# Patient Record
Sex: Female | Born: 1980 | Race: White | Hispanic: No | Marital: Married | Smoking: Never smoker
Health system: Southern US, Community
[De-identification: ages and names within clinical notes are randomized; demographics above are authoritative.]

## PROBLEM LIST (undated history)

## (undated) DIAGNOSIS — K219 Gastro-esophageal reflux disease without esophagitis: Secondary | ICD-10-CM

## (undated) DIAGNOSIS — Z9889 Other specified postprocedural states: Secondary | ICD-10-CM

## (undated) DIAGNOSIS — R112 Nausea with vomiting, unspecified: Secondary | ICD-10-CM

## (undated) HISTORY — PX: OTHER SURGICAL HISTORY: SHX169

## (undated) HISTORY — PX: DILATION AND CURETTAGE OF UTERUS: SHX78

---
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
## 2011-05-19 HISTORY — PX: OTHER SURGICAL HISTORY: SHX169

## 2011-11-04 ENCOUNTER — Ambulatory Visit: Payer: Self-pay | Admitting: Obstetrics and Gynecology

## 2011-11-04 LAB — BASIC METABOLIC PANEL
Anion Gap: 8 (ref 7–16)
Calcium, Total: 8.7 mg/dL (ref 8.5–10.1)
Chloride: 105 mmol/L (ref 98–107)
EGFR (African American): >60
EGFR (Non-African Amer.): >60
Glucose: 90 mg/dL (ref 65–99)
Osmolality: 278 (ref 275–301)
Potassium: 3.5 mmol/L (ref 3.5–5.1)
Sodium: 140 mmol/L (ref 136–145)

## 2011-11-04 LAB — CBC
HCT: 41.7 % (ref 35.0–47.0)
HGB: 14.2 g/dL (ref 12.0–16.0)
MCH: 31.4 pg (ref 26.0–34.0)
MCHC: 34.1 g/dL (ref 32.0–36.0)
Platelet: 256 10*3/uL (ref 150–440)

## 2011-11-04 LAB — PREGNANCY, URINE: Pregnancy Test, Urine: NEGATIVE m[IU]/mL

## 2011-11-06 ENCOUNTER — Ambulatory Visit: Payer: Self-pay | Admitting: Obstetrics and Gynecology

## 2011-11-11 LAB — PATHOLOGY REPORT

## 2012-03-25 ENCOUNTER — Other Ambulatory Visit: Payer: Self-pay | Admitting: Obstetrics and Gynecology

## 2012-03-25 LAB — GLUCOSE, RANDOM: Glucose: 90 mg/dL (ref 65–99)

## 2013-05-18 HISTORY — PX: INTRAUTERINE DEVICE (IUD) INSERTION: SHX5877

## 2013-09-12 ENCOUNTER — Encounter: Payer: Self-pay | Admitting: Pediatric Cardiology

## 2014-02-15 ENCOUNTER — Inpatient Hospital Stay: Payer: Self-pay | Admitting: Obstetrics and Gynecology

## 2014-02-15 LAB — CBC WITH DIFFERENTIAL/PLATELET
BASOS ABS: 0 10*3/uL (ref 0.0–0.1)
Basophil %: 0.3 %
Eosinophil #: 0.1 10*3/uL (ref 0.0–0.7)
Eosinophil %: 1.2 %
HCT: 40.2 % (ref 35.0–47.0)
HGB: 13.5 g/dL (ref 12.0–16.0)
LYMPHS ABS: 1.8 10*3/uL (ref 1.0–3.6)
LYMPHS PCT: 16.1 %
MCH: 31 pg (ref 26.0–34.0)
MCHC: 33.7 g/dL (ref 32.0–36.0)
MCV: 92 fL (ref 80–100)
Monocyte #: 0.9 x10 3/mm (ref 0.2–0.9)
Monocyte %: 8.5 %
Neutrophil #: 8 10*3/uL — ABNORMAL HIGH (ref 1.4–6.5)
Neutrophil %: 73.9 %
Platelet: 213 10*3/uL (ref 150–440)
RBC: 4.37 10*6/uL (ref 3.80–5.20)
RDW: 13.7 % (ref 11.5–14.5)
WBC: 10.9 10*3/uL (ref 3.6–11.0)

## 2014-02-16 LAB — COMPREHENSIVE METABOLIC PANEL
ALT: 16 U/L
ANION GAP: 9 (ref 7–16)
Albumin: 2.3 g/dL — ABNORMAL LOW (ref 3.4–5.0)
Alkaline Phosphatase: 140 U/L — ABNORMAL HIGH
BUN: 14 mg/dL (ref 7–18)
Bilirubin,Total: 0.4 mg/dL (ref 0.2–1.0)
CALCIUM: 8 mg/dL — AB (ref 8.5–10.1)
CREATININE: 0.78 mg/dL (ref 0.60–1.30)
Chloride: 108 mmol/L — ABNORMAL HIGH (ref 98–107)
Co2: 23 mmol/L (ref 21–32)
GLUCOSE: 65 mg/dL (ref 65–99)
OSMOLALITY: 278 (ref 275–301)
POTASSIUM: 3.9 mmol/L (ref 3.5–5.1)
SGOT(AST): 20 U/L (ref 15–37)
SODIUM: 140 mmol/L (ref 136–145)
Total Protein: 5.1 g/dL — ABNORMAL LOW (ref 6.4–8.2)

## 2014-02-16 LAB — CREATININE, URINE, RANDOM: CREATININE, URINE RANDOM: 124.4 mg/dL (ref 30.0–125.0)

## 2014-02-16 LAB — PROTEIN, URINE, RANDOM: PROTEIN, RANDOM URINE: 349 mg/dL — AB (ref 0–12)

## 2014-02-18 LAB — HEMATOCRIT: HCT: 26 % — AB (ref 35.0–47.0)

## 2014-02-19 LAB — CBC WITH DIFFERENTIAL/PLATELET
BASOS ABS: 0 10*3/uL (ref 0.0–0.1)
Basophil %: 0.3 %
EOS ABS: 0.2 10*3/uL (ref 0.0–0.7)
EOS PCT: 2.2 %
HCT: 22.8 % — AB (ref 35.0–47.0)
HGB: 7.8 g/dL — AB (ref 12.0–16.0)
Lymphocyte #: 1.6 10*3/uL (ref 1.0–3.6)
Lymphocyte %: 17.1 %
MCH: 31.5 pg (ref 26.0–34.0)
MCHC: 34.1 g/dL (ref 32.0–36.0)
MCV: 92 fL (ref 80–100)
Monocyte #: 0.8 x10 3/mm (ref 0.2–0.9)
Monocyte %: 8 %
NEUTROS ABS: 6.9 10*3/uL — AB (ref 1.4–6.5)
Neutrophil %: 72.4 %
Platelet: 168 10*3/uL (ref 150–440)
RBC: 2.47 10*6/uL — ABNORMAL LOW (ref 3.80–5.20)
RDW: 13.7 % (ref 11.5–14.5)
WBC: 9.6 10*3/uL (ref 3.6–11.0)

## 2014-02-20 LAB — PIH PROFILE
AST: 49 U/L — AB (ref 15–37)
Anion Gap: 7 (ref 7–16)
BUN: 12 mg/dL (ref 7–18)
CALCIUM: 8.4 mg/dL — AB (ref 8.5–10.1)
CHLORIDE: 109 mmol/L — AB (ref 98–107)
Co2: 25 mmol/L (ref 21–32)
Creatinine: 0.73 mg/dL (ref 0.60–1.30)
EGFR (African American): >60
GLUCOSE: 88 mg/dL (ref 65–99)
HCT: 23.7 % — AB (ref 35.0–47.0)
HGB: 8.2 g/dL — ABNORMAL LOW (ref 12.0–16.0)
MCH: 31.9 pg (ref 26.0–34.0)
MCHC: 34.5 g/dL (ref 32.0–36.0)
MCV: 93 fL (ref 80–100)
Osmolality: 280 (ref 275–301)
POTASSIUM: 3.9 mmol/L (ref 3.5–5.1)
Platelet: 241 10*3/uL (ref 150–440)
RBC: 2.55 10*6/uL — ABNORMAL LOW (ref 3.80–5.20)
RDW: 13.5 % (ref 11.5–14.5)
SODIUM: 141 mmol/L (ref 136–145)
Uric Acid: 5.5 mg/dL (ref 2.6–6.0)
WBC: 6.9 10*3/uL (ref 3.6–11.0)

## 2014-02-21 LAB — PROTEIN, URINE, RANDOM: PROTEIN, RANDOM URINE: 10 mg/dL (ref 0–12)

## 2014-02-21 LAB — CREATININE, URINE, RANDOM: Creatinine, Urine Random: 19.2 mg/dL — ABNORMAL LOW (ref 30.0–125.0)

## 2014-05-18 HISTORY — PX: IUD REMOVAL: SHX5392

## 2014-08-27 ENCOUNTER — Encounter: Payer: Self-pay | Admitting: *Deleted

## 2014-09-08 NOTE — Op Note (Signed)
PATIENT NAME:  Tiffany [name], Tiffany [name] MR#:  [id number] DATE OF BIRTH:  1980/07/17  DATE OF PROCEDURE:  02/17/2014  PREOPERATIVE DIAGNOSES: 76.  A 34 year old gravida 1  at 40 weeks 6 days gestation.  2.  Failed induction of labor secondary to active phase arrest at 8 centimeters.  3.  Preeclampsia, without severe features based on blood pressure and protein-creatinine ratio. 4.  Polyhydramnios with an amniotic fluid index of 28 cm.  POSTOPERATIVE DIAGNOSES:  51.  A 34 year old gravida 1  at 40 weeks 6 days gestation.  2.  Failed induction of labor secondary to active phase arrest at 8 centimeters.  3.  Preeclampsia, without severe features based on blood pressure and protein-creatinine ratio. 4.  Two small bladder windows. 5.  Polyhydramnios with an amniotic fluid index of 28 cm.  OPERATION PERFORMED: Low transverse cesarean section via Pfannenstiel incision and oversewing of the noted bladder windows.   ANESTHESIA USED:  Epidural.  PRIMARY SURGEON: Stoney Bang. Georgianne Fick, MD  PREOPERATIVE ANTIBIOTICS: Ancef 2 grams.  ESTIMATED BLOOD LOSS:  900 mL.   OPERATIVE FLUIDS: 1500 mL of crystalloid.  URINE OUTPUT: 200 mL.  DRAINS OR TUBES: Foley to gravity drainage and On-Q catheter system.   IMPLANTS: None.   COMPLICATIONS: Two thin areas were noted on the bladder serosa. 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 The patient did have frankly bloody urine very early during her labor course and actually had her Foley catheter replaced secondary to clot.   INTRAOPERATIVE FINDINGS: Normal tubes, ovaries, and uterus. On entry into the abdominal cavity, the bladder was noted to be distended and had two small bladder windows. There were no apparent cystotomies and the entry into the peritoneal cavity had been accomplished superior to the bladder.  Delivery resulted in the birth of a liveborn female infant weighing 3530 grams, 7 pounds  13 ounces, Apgars 9 and 9.   SPECIMENS REMOVED: None.   COMPLICATIONS: None.   THE PATIENT'S CONDITION FOLLOWING PROCEDURE: Stable.   PROCEDURE IN DETAIL: Risks, benefits, and alternatives of the procedure were discussed with the patient prior to proceeding to the operating room. The patient had had a protracted  active phase, and had been started on Pitocin and had an IUPC placed showing adequate MVUs with no change over 4 hours. We did have a growth scan within the past week, estimating fetal weight at 8 pounds 1 ounce. The pelvis on clinical pelvimetry felt adequate. However, given the lack of progress and the adequate contractions, we discussed that this likely represented either a problem with the pelvis or an issue with the baby's size.  The fetus was noted to be in the OA position.   After discussion, patient elected to proceed with primary low transverse C-section for delivery.  She was taken to the operating room where her epidural was dosed for C-section. She was prepped and draped in the usual sterile fashion. A timeout was performed and the level of anesthetic was checked and noted to be adequate prior to proceeding with the case.   A Pfannenstiel skin incision was made 2 cm above the pubic symphysis, carried down sharply to the level of the rectus fascia. The fascia was incised in the midline. The fascial incision was extended using Mayo scissors. The superior border of the fascia was grasped with 2 Kocher  clamps. The underlying rectus muscles were dissected off the fascia bluntly. The median raphe was incised using Mayo scissors. The  inferior border of the rectus fascia was dissected off the rectus muscle in a similar fashion. The midline was identified and the peritoneal cavity was entered bluntly in the superior portion under the median raphe. The peritoneal incision was then extended using manual traction. The bladder appeared to be somewhat distended and inspection of the bladder also  revealed 3 areas which appeared to have some thinning of the serosal lining of the bladder. A bladder blade was placed. A bladder flap was then developed using Metzenbaum scissors, further developed digitally before replacing the bladder blade to displace the bladder caudally. A low transverse incision was then scored on the lower uterine segment. The uterus was entered bluntly using the operator's finger. The uterine incision was extended using manual traction. On placing the operator's hand into the hysterotomy incision, the infant was noted to be in the OA position, wedged into the pelvis. The vertex was grasped, flexed, brought to the level of the hysterotomy and delivered atraumatically using fundal pressure. There was some difficulty in delivering the shoulders, necessitating delivery of the anterior arm, which happened to be the right fetal arm. This was accomplished by splinting the arm across the fetal chest and delivering it. The remainder of the body then delivered with ease. Cord was clamped and cut. The infant was suctioned and passed to the awaiting pediatricians. Placenta was delivered using manual extraction. The uterus was exteriorized, repaired in a 2 layer closure with the first being a running locked 0 Vicryl, the second a vertical imbricating of 0 Vicryl. Following uterine closure, the uterus was reinspected. There was one aspect on the right aspect of the incision which had some oozing and was oversewn with a simple figure-of-eight of 0 Vicryl. The uterus was then returned to the abdomen. The peritoneal gutters were wiped clean of clots and debris. The hysterotomy was reinspected and continued to be hemostatic. The bladder was reinspected noting 2 small thinnings in the serosal lining. These were oversewn in a running fashion using a 4-0 Monocryl. Following oversewing of the bladder, the peritoneum was closed using a running 2-0 Vicryl. A 2-0 Vicryl was then used to place a mattress stitch and  reapproximate the rectus muscles in the midline. The fascia was tented up using a Kocher clamp. The On-Q trocars were placed subfascially per the usual protocol and used to introduce the On-Q catheters while removing the trocars. The fascia was closed using a looped #1 PDS in a running fashion. Following fascial closure, the subcutaneous dead space was reapproximated using 3 interrupted stitches of 0 Vicryl. Skin was closed using a 4-0 Monocryl in a subcuticular fashion. The incision was dressed with Dermabond as was the On-Q catheter site. The On-Q catheter sites were bolused with 5 mL of 0.5% bupivacaine each. Sponge, needle, and instrument counts were correct x 2. The patient tolerated the procedure well and was taken to recovery room in stable condition. Prior to the hysterotomy closure, there was some uterine atony encountered. This was manage with 10 units of intramuscular Pitocin administered directly into the uterus.   ____________________________ Stoney Bang. Georgianne Fick, MD ams:LT D: 02/18/2014 10:25:27 ET T: 02/18/2014 17:19:55 ET JOB#: [id number]  cc: Stoney Bang. Georgianne Fick, MD, <Dictator> Conan Bowens Madelon Lips MD ELECTRONICALLY SIGNED 03/15/2014 13:31

## 2014-09-17 ENCOUNTER — Ambulatory Visit: Payer: 59 | Attending: Nurse Practitioner | Admitting: Physical Therapy

## 2014-09-17 ENCOUNTER — Encounter: Payer: Self-pay | Admitting: Physical Therapy

## 2014-09-17 DIAGNOSIS — R278 Other lack of coordination: Secondary | ICD-10-CM

## 2014-09-17 DIAGNOSIS — Z7409 Other reduced mobility: Secondary | ICD-10-CM

## 2014-09-17 DIAGNOSIS — M533 Sacrococcygeal disorders, not elsewhere classified: Secondary | ICD-10-CM | POA: Diagnosis not present

## 2014-09-17 DIAGNOSIS — M6289 Other specified disorders of muscle: Secondary | ICD-10-CM

## 2014-09-17 DIAGNOSIS — M629 Disorder of muscle, unspecified: Secondary | ICD-10-CM

## 2014-09-18 NOTE — Patient Instructions (Signed)
__Proper standing posture: stacking thorax over pelvis, posterior tilt. Holding baby with deep core activation and no hip abd/hip sway __Pelvic floor activation 10 sec, 7 reps in supine __Posterior tilt for neutral spine  __Log rolling and exhaling with lifting  __modifiy activities to minimize strain (i.e. Changing diaper on higher surface instead of floor).

## 2014-09-18 NOTE — Therapy (Signed)
[place] MAIN [facility] SERVICES [address] [place], [place], [address] Phone: [phone]   Fax:  [phone]  Physical Therapy Evaluation  Patient Details  Name: Tiffany [name] MRN: [id number] Date of Birth: Oct 10, 1980 Referring Provider:  Josie Saunders, NP  Encounter Date: 09/17/2014      PT End of Session - 09/18/14 1237    Visit Number 1   Number of Visits 12   Date for PT Re-Evaluation 11/20/14   PT Start Time 8144   PT Stop Time 1515   PT Time Calculation (min) 70 min   Activity Tolerance Patient tolerated treatment well;No increased pain   Behavior During Therapy [facility] for tasks assessed/performed      History reviewed. No pertinent past medical history.  History reviewed. No pertinent past surgical history.  There were no vitals filed for this visit.  Visit Diagnosis:  Coordination abnormal - Plan: PT plan of care cert/re-cert  Tight fascia - Plan: PT plan of care cert/re-cert  Mobility impaired - Plan: PT plan of care cert/re-cert      Subjective Assessment - 09/17/14 1430    Subjective During pregnancy SIJ pain on L, groin pain, and radiating pain down LE. After delviery, these pains resolved except for when pt would lift baby, car seat, stroller. Denied urinary leakage, bowel dysfunction. Pain also occurs with sexual intercourse related a position when laying on side of bed, one leg propped  up over husband's shoulder and other leg over edge of the bed.     Pertinent History C-section delivery, laproscopic endometriosis,    Limitations Lifting;House hold activities   Patient Stated Goals  build her core to improve posture and back pain    Currently in Pain? Yes   Pain Score 8    Pain Location Back   Pain Orientation Posterior;Other (Comment)   Pain Descriptors / Indicators --  like a knife, intermittent    Pain Type Other (Comment)   Aggravating Factors  bending forward and return to upright position, lifting and  lowering     Pain Relieving Factors completion of task    Effect of Pain on Daily Activities lifting baby + car seat, stroller, carrying baby out of tub, bending             [facility] PT Assessment - 09/17/14 1642    Assessment   Medical Diagnosis SIJ pain    Onset Date 01/17/14   Prior Therapy none   Precautions   Precautions None   Restrictions   Weight Bearing Restrictions No   Balance Screen   Has the patient fallen in the past 6 months No   Has the patient had a decrease in activity level because of a fear of falling?  No   Home Environment   Living Enviornment Private residence   Prior Function   Level of Independence Independent with basic ADLs   Observation/Other Assessments   Other Surveys  --  Mod ODI: 40%, FABQ: work 45%, physical activity 83%    Posture/Postural Control   Posture/Postural Control Postural limitations   Posture Comments hyperextended knees in stance, thorax to pelvis: posterior                 Pelvic Floor Special Questions - 09/17/14 1651    Prior Pelvic/Prostate Exam Yes   Diastasis Recti neg   Urinary Leakage No   Urinary urgency No   Urinary frequency N   Fecal incontinence No   Falling out feeling (  prolapse) No   External Perineal Exam Y   Scar Other  C-sec   Scar other C-section scar with limited mobility LQ of abdomien   Perineal Body/Introitus other neg   External Palpation neg   Prolapse None   Pelvic Floor Internal Exam Y   Exam Type Vaginal   Strength good squeeze, good lift, able to hold agaisnt strong resistance   Strength # of reps 7   Strength # of seconds 10   Tone Neg                   PT Education - 09/18/14 1236    Education provided Yes   Person(s) Educated Patient   Methods Explanation;Demonstration;Tactile cues;Verbal cues   Comprehension Verbalized understanding;Returned demonstration;Verbal cues required;Tactile cues required             PT Long Term Goals - 09/18/14 1254    PT  LONG TERM GOAL #1   Title Pt will be compliant with relaxation techniques (diaphragmatic breathing, body scan, paced breathing) in order to decrease body mm tensions.    Time 12   Period Weeks   Status New   PT LONG TERM GOAL #2   Title Pt will be IND with HEP in order to return to ADLs and functional activities such as lifting/carrying baby from car to shopping cart.   Time 12   Period Weeks   Status New   PT LONG TERM GOAL #3   Title Pt will decrease her FABQ-work subscale from 45% (19/42)  to < 30% (<2 out of 42) in order to perform work duties.    Time 12   Period Weeks   Status New   PT LONG TERM GOAL #4   Title Pt will be able to return to jogging for 20 min with deep core activation and without gait deviations in order to run 3 mi.    Time 12   Period Weeks   Status New               Plan - 09/18/14 1241    Clinical Impression Statement Pt is a 34 yo female post-partum 7 mo who c/o L SIJ pain.     Pt will benefit from skilled therapeutic intervention in order to improve on the following deficits Abnormal gait;Pain;Decreased strength;Decreased mobility;Decreased activity tolerance;Decreased balance;Decreased range of motion;Increased fascial restricitons;Increased muscle spasms;Decreased coordination;Decreased endurance;Decreased scar mobility;Postural dysfunction   Rehab Potential Good   Clinical Impairments Affecting Rehab Potential c-section/ abd scars, work duties require patient handling    PT Frequency 1x / week   PT Duration 12 weeks   PT Treatment/Interventions ADLs/Self Care Home Management;Therapeutic activities;Patient/family education;Scar mobilization;Aquatic Therapy;Therapeutic exercise;Biofeedback;Gait training;Balance training;Manual techniques;Dry needling;Stair training;Neuromuscular re-education;Manual lymph drainage;Energy conservation;Functional mobility training;Other (comment)  Pain science education,Relaxation techniques   PT Next Visit Plan spinal  manual Tx, STM    PT Home Exercise Plan ROM HEP   Consulted and Agree with Plan of Care Patient         Problem List There are no active problems to display for this patient.   Jerl Mina, PT, DPT .09/18/2014, 2:54 PM  [place] MAIN [facility] SERVICES [address] [place], [place], [address] Phone: [phone]   Fax:  [phone]

## 2014-09-26 ENCOUNTER — Encounter: Payer: Self-pay | Admitting: Physical Therapy

## 2014-10-11 NOTE — Addendum Note (Signed)
Addended by: Jerl Mina on: 10/11/2014 10:16 AM   Modules accepted: Orders

## 2015-06-07 DIAGNOSIS — O34219 Maternal care for unspecified type scar from previous cesarean delivery: Secondary | ICD-10-CM | POA: Diagnosis not present

## 2015-06-07 DIAGNOSIS — Z331 Pregnant state, incidental: Secondary | ICD-10-CM | POA: Diagnosis not present

## 2015-06-07 DIAGNOSIS — Z3A11 11 weeks gestation of pregnancy: Secondary | ICD-10-CM | POA: Diagnosis not present

## 2015-06-07 DIAGNOSIS — O09291 Supervision of pregnancy with other poor reproductive or obstetric history, first trimester: Secondary | ICD-10-CM | POA: Diagnosis not present

## 2015-06-07 LAB — OB RESULTS CONSOLE RPR: RPR: NONREACTIVE

## 2015-06-07 LAB — OB RESULTS CONSOLE HIV ANTIBODY (ROUTINE TESTING): HIV: NONREACTIVE

## 2015-06-07 LAB — OB RESULTS CONSOLE VARICELLA ZOSTER ANTIBODY, IGG: Varicella: IMMUNE

## 2015-06-07 LAB — OB RESULTS CONSOLE RUBELLA ANTIBODY, IGM: RUBELLA: IMMUNE

## 2015-06-07 LAB — OB RESULTS CONSOLE HEPATITIS B SURFACE ANTIGEN: Hepatitis B Surface Ag: NEGATIVE

## 2015-06-21 DIAGNOSIS — Z3183 Encounter for assisted reproductive fertility procedure cycle: Secondary | ICD-10-CM | POA: Diagnosis not present

## 2015-06-21 DIAGNOSIS — Z36 Encounter for antenatal screening of mother: Secondary | ICD-10-CM | POA: Diagnosis not present

## 2015-06-21 DIAGNOSIS — Z3A13 13 weeks gestation of pregnancy: Secondary | ICD-10-CM | POA: Diagnosis not present

## 2015-06-21 DIAGNOSIS — O34219 Maternal care for unspecified type scar from previous cesarean delivery: Secondary | ICD-10-CM | POA: Diagnosis not present

## 2015-07-12 DIAGNOSIS — Z3A16 16 weeks gestation of pregnancy: Secondary | ICD-10-CM | POA: Diagnosis not present

## 2015-07-12 DIAGNOSIS — Z9889 Other specified postprocedural states: Secondary | ICD-10-CM | POA: Diagnosis not present

## 2015-08-02 DIAGNOSIS — Z3A19 19 weeks gestation of pregnancy: Secondary | ICD-10-CM | POA: Diagnosis not present

## 2015-08-02 DIAGNOSIS — Z98891 History of uterine scar from previous surgery: Secondary | ICD-10-CM | POA: Diagnosis not present

## 2015-08-02 DIAGNOSIS — Z3491 Encounter for supervision of normal pregnancy, unspecified, first trimester: Secondary | ICD-10-CM | POA: Diagnosis not present

## 2015-08-02 DIAGNOSIS — Z36 Encounter for antenatal screening of mother: Secondary | ICD-10-CM | POA: Diagnosis not present

## 2015-08-02 DIAGNOSIS — Z9889 Other specified postprocedural states: Secondary | ICD-10-CM | POA: Diagnosis not present

## 2015-08-22 DIAGNOSIS — Z3A22 22 weeks gestation of pregnancy: Secondary | ICD-10-CM | POA: Diagnosis not present

## 2015-08-22 DIAGNOSIS — O09812 Supervision of pregnancy resulting from assisted reproductive technology, second trimester: Secondary | ICD-10-CM | POA: Diagnosis not present

## 2015-09-02 DIAGNOSIS — Z3A24 24 weeks gestation of pregnancy: Secondary | ICD-10-CM | POA: Diagnosis not present

## 2015-09-02 DIAGNOSIS — Z8759 Personal history of other complications of pregnancy, childbirth and the puerperium: Secondary | ICD-10-CM | POA: Diagnosis not present

## 2015-09-02 DIAGNOSIS — O09291 Supervision of pregnancy with other poor reproductive or obstetric history, first trimester: Secondary | ICD-10-CM | POA: Diagnosis not present

## 2015-10-02 DIAGNOSIS — Z3A27 27 weeks gestation of pregnancy: Secondary | ICD-10-CM | POA: Diagnosis not present

## 2015-10-02 LAB — OB RESULTS CONSOLE GBS: STREP GROUP B AG: POSITIVE

## 2015-11-04 DIAGNOSIS — Z3A32 32 weeks gestation of pregnancy: Secondary | ICD-10-CM | POA: Diagnosis not present

## 2015-11-04 DIAGNOSIS — Z23 Encounter for immunization: Secondary | ICD-10-CM | POA: Diagnosis not present

## 2015-12-02 DIAGNOSIS — Z3A36 36 weeks gestation of pregnancy: Secondary | ICD-10-CM | POA: Diagnosis not present

## 2015-12-02 DIAGNOSIS — Z3493 Encounter for supervision of normal pregnancy, unspecified, third trimester: Secondary | ICD-10-CM | POA: Diagnosis not present

## 2015-12-02 DIAGNOSIS — Z36 Encounter for antenatal screening of mother: Secondary | ICD-10-CM | POA: Diagnosis not present

## 2015-12-02 DIAGNOSIS — O34219 Maternal care for unspecified type scar from previous cesarean delivery: Secondary | ICD-10-CM | POA: Diagnosis not present

## 2015-12-18 ENCOUNTER — Encounter
Admission: RE | Admit: 2015-12-18 | Discharge: 2015-12-18 | Disposition: A | Discharge status: Home / Self Care | Payer: 59 | Source: Ambulatory Visit | Attending: Obstetrics and Gynecology | Admitting: Obstetrics and Gynecology

## 2015-12-18 DIAGNOSIS — Z888 Allergy status to other drugs, medicaments and biological substances status: Secondary | ICD-10-CM | POA: Diagnosis not present

## 2015-12-18 DIAGNOSIS — Z3A39 39 weeks gestation of pregnancy: Secondary | ICD-10-CM | POA: Diagnosis not present

## 2015-12-18 DIAGNOSIS — Z880 Allergy status to penicillin: Secondary | ICD-10-CM | POA: Diagnosis not present

## 2015-12-18 DIAGNOSIS — O34211 Maternal care for low transverse scar from previous cesarean delivery: Secondary | ICD-10-CM | POA: Diagnosis not present

## 2015-12-18 HISTORY — DX: Other specified postprocedural states: Z98.890

## 2015-12-18 HISTORY — DX: Gastro-esophageal reflux disease without esophagitis: K21.9

## 2015-12-18 HISTORY — DX: Other specified postprocedural states: R11.2

## 2015-12-18 LAB — TYPE AND SCREEN
ABO/RH(D): O POS
ANTIBODY SCREEN: NEGATIVE
Extend sample reason: UNDETERMINED

## 2015-12-18 LAB — CBC
HEMATOCRIT: 36.6 % (ref 35.0–47.0)
Hemoglobin: 13.2 g/dL (ref 12.0–16.0)
MCH: 32.5 pg (ref 26.0–34.0)
MCHC: 36 g/dL (ref 32.0–36.0)
MCV: 90.3 fL (ref 80.0–100.0)
Platelets: 177 10*3/uL (ref 150–440)
RBC: 4.05 MIL/uL (ref 3.80–5.20)
RDW: 14.3 % (ref 11.5–14.5)
WBC: 8.2 10*3/uL (ref 3.6–11.0)

## 2015-12-18 NOTE — Patient Instructions (Signed)
  Your procedure is scheduled on: tomorrow at 5;30 AM, Come in through the ED.   Remember: Instructions that are not followed completely may result in serious medical risk, up to and including death, or upon the discretion of your surgeon and anesthesiologist your surgery may need to be rescheduled.    _x___ 1. Do not eat food or drink liquids after midnight. No gum chewing or hard candies.     ____ 2. No Alcohol for 24 hours before or after surgery.   ____ 3. Bring all medications with you on the day of surgery if instructed.    __x__ 4. Notify your doctor if there is any change in your medical condition     (cold, fever, infections).     Do not wear jewelry, make-up, hairpins, clips or nail polish.  Do not wear lotions, powders, or perfumes.   Do not shave 48 hours prior to surgery. Men may shave face and neck.  Do not bring valuables to the hospital.    [facility] is not responsible for any belongings or valuables.               Contacts, dentures or bridgework may not be worn into surgery.  Leave your suitcase in the car. After surgery it may be brought to your room.  For patients admitted to the hospital, discharge time is determined by your treatment team.   Patients discharged the day of surgery will not be allowed to drive home.    Please read over the following fact sheets that you were given:   [facility] Preparing for Surgery  __x__ Take these medicines the morning of surgery with A SIP OF WATER:    1. Omeprazole Magnesium (PRILOSEC OTC PO) take tonight and again in am.   ____ Fleet Enema (as directed)   _x__ Use SAGE wipes as directed on instruction sheet  ____ Use inhalers on the day of surgery and bring to hospital day of surgery  ____ Stop metformin 2 days prior to surgery    ____ Take 1/2 of usual insulin dose the night before surgery and none on the morning of  surgery.   ____ Stop Coumadin/Plavix/aspirin on apply.  ____ Stop Anti-inflammatories such as  Advil, Aleve, Ibuprofen, Motrin, Naproxen,  Naprosyn, Goodies powders or aspirin products.   ____ Stop supplements until after surgery.    ____ Bring C-Pap to the hospital.

## 2015-12-19 ENCOUNTER — Inpatient Hospital Stay: Payer: 59 | Admitting: Certified Registered"

## 2015-12-19 ENCOUNTER — Encounter
Admission: RE | Disposition: A | Discharge status: Home / Self Care | Payer: Self-pay | Source: Ambulatory Visit | Attending: Obstetrics and Gynecology

## 2015-12-19 ENCOUNTER — Inpatient Hospital Stay
Admission: RE | Admit: 2015-12-19 | Discharge: 2015-12-21 | DRG: 766 | Disposition: A | Discharge status: Home / Self Care | Payer: 59 | Source: Ambulatory Visit | Attending: Obstetrics and Gynecology | Admitting: Obstetrics and Gynecology

## 2015-12-19 DIAGNOSIS — O34211 Maternal care for low transverse scar from previous cesarean delivery: Principal | ICD-10-CM | POA: Diagnosis present

## 2015-12-19 DIAGNOSIS — Z3A39 39 weeks gestation of pregnancy: Secondary | ICD-10-CM | POA: Diagnosis not present

## 2015-12-19 DIAGNOSIS — Z880 Allergy status to penicillin: Secondary | ICD-10-CM

## 2015-12-19 DIAGNOSIS — Z888 Allergy status to other drugs, medicaments and biological substances status: Secondary | ICD-10-CM

## 2015-12-19 DIAGNOSIS — O34219 Maternal care for unspecified type scar from previous cesarean delivery: Secondary | ICD-10-CM | POA: Diagnosis not present

## 2015-12-19 LAB — HIV ANTIBODY (ROUTINE TESTING W REFLEX): HIV SCREEN 4TH GENERATION: NONREACTIVE

## 2015-12-19 LAB — RPR: RPR: NONREACTIVE

## 2015-12-19 SURGERY — Surgical Case
Anesthesia: Spinal | Site: Abdomen | Wound class: Clean Contaminated

## 2015-12-19 MED ORDER — DIPHENHYDRAMINE HCL 50 MG/ML IJ SOLN: 12.5000 mg | INTRAMUSCULAR | Status: DC | PRN

## 2015-12-19 MED ORDER — SENNOSIDES-DOCUSATE SODIUM 8.6-50 MG PO TABS
2.0000 | ORAL_TABLET | ORAL | Status: DC
  Administered 2015-12-20: 2 via ORAL
  Filled 2015-12-19: qty 2

## 2015-12-19 MED ORDER — ONDANSETRON HCL 4 MG/2ML IJ SOLN: 4.0000 mg | Freq: Three times a day (TID) | INTRAMUSCULAR | Status: DC | PRN

## 2015-12-19 MED ORDER — FENTANYL CITRATE (PF) 100 MCG/2ML IJ SOLN: INTRAMUSCULAR | Status: DC | PRN

## 2015-12-19 MED ORDER — WITCH HAZEL-GLYCERIN EX PADS: 1.0000 "application " | MEDICATED_PAD | CUTANEOUS | Status: DC | PRN

## 2015-12-19 MED ORDER — DIPHENHYDRAMINE HCL 25 MG PO CAPS: 25.0000 mg | ORAL_CAPSULE | ORAL | Status: DC | PRN

## 2015-12-19 MED ORDER — SIMETHICONE 80 MG PO CHEW
80.0000 mg | CHEWABLE_TABLET | ORAL | Status: DC | PRN
  Administered 2015-12-20: 80 mg via ORAL

## 2015-12-19 MED ORDER — SODIUM CHLORIDE 0.9% FLUSH: 3.0000 mL | INTRAVENOUS | Status: DC | PRN

## 2015-12-19 MED ORDER — LACTATED RINGERS IV SOLN
INTRAVENOUS | Status: DC
  Administered 2015-12-19: 07:00:00 via INTRAVENOUS

## 2015-12-19 MED ORDER — BUPIVACAINE 0.25 % ON-Q PUMP DUAL CATH 400 ML
400.0000 mL | INJECTION | Status: DC
  Filled 2015-12-19: qty 400

## 2015-12-19 MED ORDER — EPHEDRINE SULFATE-NACL 50-0.9 MG/10ML-% IV SOSY
PREFILLED_SYRINGE | INTRAVENOUS | Status: DC | PRN
  Administered 2015-12-19 (×2): 10 mg via INTRAVENOUS
  Administered 2015-12-19 (×2): 5 mg via INTRAVENOUS
  Administered 2015-12-19: 10 mg via INTRAVENOUS
  Administered 2015-12-19 (×2): 5 mg via INTRAVENOUS

## 2015-12-19 MED ORDER — OXYTOCIN 40 UNITS IN LACTATED RINGERS INFUSION - SIMPLE MED
2.5000 [IU]/h | INTRAVENOUS | Status: DC
  Filled 2015-12-19: qty 1000

## 2015-12-19 MED ORDER — MEPERIDINE HCL 25 MG/ML IJ SOLN: 6.2500 mg | INTRAMUSCULAR | Status: DC | PRN

## 2015-12-19 MED ORDER — MENTHOL 3 MG MT LOZG: 1.0000 | LOZENGE | OROMUCOSAL | Status: DC | PRN

## 2015-12-19 MED ORDER — OXYTOCIN 40 UNITS IN LACTATED RINGERS INFUSION - SIMPLE MED
INTRAVENOUS | Status: DC | PRN
  Administered 2015-12-19: 1000 mL via INTRAVENOUS
  Administered 2015-12-19: 200 mL via INTRAVENOUS

## 2015-12-19 MED ORDER — NALOXONE HCL 2 MG/2ML IJ SOSY
1.0000 ug/kg/h | PREFILLED_SYRINGE | INTRAVENOUS | Status: DC | PRN
  Filled 2015-12-19: qty 2

## 2015-12-19 MED ORDER — NALBUPHINE HCL 10 MG/ML IJ SOLN: 5.0000 mg | Freq: Once | INTRAMUSCULAR | Status: DC | PRN

## 2015-12-19 MED ORDER — DIPHENHYDRAMINE HCL 25 MG PO CAPS: 25.0000 mg | ORAL_CAPSULE | Freq: Four times a day (QID) | ORAL | Status: DC | PRN

## 2015-12-19 MED ORDER — NALOXONE HCL 0.4 MG/ML IJ SOLN: 0.4000 mg | INTRAMUSCULAR | Status: DC | PRN

## 2015-12-19 MED ORDER — COCONUT OIL OIL
1.0000 "application " | TOPICAL_OIL | Status: DC | PRN
  Filled 2015-12-19: qty 120

## 2015-12-19 MED ORDER — MORPHINE SULFATE (PF) 0.5 MG/ML IJ SOLN: INTRAMUSCULAR | Status: DC | PRN

## 2015-12-19 MED ORDER — NALBUPHINE HCL 10 MG/ML IJ SOLN: 5.0000 mg | INTRAMUSCULAR | Status: DC | PRN

## 2015-12-19 MED ORDER — DIBUCAINE 1 % RE OINT: 1.0000 "application " | TOPICAL_OINTMENT | RECTAL | Status: DC | PRN

## 2015-12-19 MED ORDER — BUPIVACAINE IN DEXTROSE 0.75-8.25 % IT SOLN
INTRATHECAL | Status: DC | PRN
  Administered 2015-12-19: 1.8 mL via INTRATHECAL

## 2015-12-19 MED ORDER — IBUPROFEN 600 MG PO TABS
600.0000 mg | ORAL_TABLET | Freq: Four times a day (QID) | ORAL | Status: DC
  Administered 2015-12-20 – 2015-12-21 (×5): 600 mg via ORAL
  Filled 2015-12-19 (×5): qty 1

## 2015-12-19 MED ORDER — SCOPOLAMINE 1 MG/3DAYS TD PT72
1.0000 | MEDICATED_PATCH | Freq: Once | TRANSDERMAL | Status: DC
  Administered 2015-12-19: 1.5 mg via TRANSDERMAL
  Filled 2015-12-19: qty 1

## 2015-12-19 MED ORDER — OXYTOCIN 40 UNITS IN LACTATED RINGERS INFUSION - SIMPLE MED
INTRAVENOUS | Status: AC
  Filled 2015-12-19: qty 1000

## 2015-12-19 MED ORDER — LACTATED RINGERS IV SOLN
INTRAVENOUS | Status: DC
  Administered 2015-12-19 (×3): via INTRAVENOUS

## 2015-12-19 MED ORDER — LACTATED RINGERS IV SOLN
INTRAVENOUS | Status: DC
  Administered 2015-12-19: 20:00:00 via INTRAVENOUS

## 2015-12-19 MED ORDER — MORPHINE SULFATE (PF) 0.5 MG/ML IJ SOLN
INTRAMUSCULAR | Status: DC | PRN
  Administered 2015-12-19: .1 mg via INTRATHECAL

## 2015-12-19 MED ORDER — PHENYLEPHRINE HCL 10 MG/ML IJ SOLN
INTRAMUSCULAR | Status: DC | PRN
Start: 2015-12-19 — End: 2015-12-19
  Administered 2015-12-19: 100 ug via INTRAVENOUS

## 2015-12-19 MED ORDER — SIMETHICONE 80 MG PO CHEW
80.0000 mg | CHEWABLE_TABLET | Freq: Three times a day (TID) | ORAL | Status: DC
  Administered 2015-12-19 – 2015-12-21 (×4): 80 mg via ORAL
  Filled 2015-12-19 (×5): qty 1

## 2015-12-19 MED ORDER — SOD CITRATE-CITRIC ACID 500-334 MG/5ML PO SOLN
30.0000 mL | ORAL | Status: AC
  Administered 2015-12-19: 30 mL via ORAL
  Filled 2015-12-19: qty 30

## 2015-12-19 MED ORDER — KETOROLAC TROMETHAMINE 30 MG/ML IJ SOLN
30.0000 mg | Freq: Four times a day (QID) | INTRAMUSCULAR | Status: AC
  Administered 2015-12-19 – 2015-12-20 (×4): 30 mg via INTRAVENOUS
  Filled 2015-12-19 (×4): qty 1

## 2015-12-19 MED ORDER — OXYCODONE-ACETAMINOPHEN 5-325 MG PO TABS: 2.0000 | ORAL_TABLET | ORAL | Status: DC | PRN

## 2015-12-19 MED ORDER — ACETAMINOPHEN 325 MG PO TABS
650.0000 mg | ORAL_TABLET | ORAL | Status: DC | PRN
  Administered 2015-12-20: 650 mg via ORAL
  Filled 2015-12-19: qty 2

## 2015-12-19 MED ORDER — SIMETHICONE 80 MG PO CHEW: 80.0000 mg | CHEWABLE_TABLET | ORAL | Status: DC

## 2015-12-19 MED ORDER — OXYCODONE-ACETAMINOPHEN 5-325 MG PO TABS
1.0000 | ORAL_TABLET | ORAL | Status: DC | PRN
  Administered 2015-12-20 – 2015-12-21 (×5): 1 via ORAL
  Filled 2015-12-19 (×5): qty 1

## 2015-12-19 MED ORDER — BUPIVACAINE HCL (PF) 0.5 % IJ SOLN
10.0000 mL | Freq: Once | INTRAMUSCULAR | Status: DC
  Filled 2015-12-19: qty 30

## 2015-12-19 MED ORDER — KETOROLAC TROMETHAMINE 30 MG/ML IJ SOLN: 30.0000 mg | Freq: Four times a day (QID) | INTRAMUSCULAR | Status: AC

## 2015-12-19 MED ORDER — CEFAZOLIN SODIUM-DEXTROSE 2-4 GM/100ML-% IV SOLN
2.0000 g | INTRAVENOUS | Status: AC
  Administered 2015-12-19: 2 g via INTRAVENOUS
  Filled 2015-12-19: qty 100

## 2015-12-19 MED ORDER — ACETAMINOPHEN 325 MG PO TABS
650.0000 mg | ORAL_TABLET | Freq: Four times a day (QID) | ORAL | Status: AC
  Administered 2015-12-20: 650 mg via ORAL
  Filled 2015-12-19 (×2): qty 2

## 2015-12-19 MED ORDER — PRENATAL MULTIVITAMIN CH
1.0000 | ORAL_TABLET | Freq: Every day | ORAL | Status: DC
  Administered 2015-12-20: 1 via ORAL
  Filled 2015-12-19 (×2): qty 1

## 2015-12-19 MED ORDER — FENTANYL CITRATE (PF) 100 MCG/2ML IJ SOLN
INTRAMUSCULAR | Status: DC | PRN
  Administered 2015-12-19: 15 ug via INTRATHECAL

## 2015-12-19 MED ORDER — BUPIVACAINE HCL (PF) 0.5 % IJ SOLN
INTRAMUSCULAR | Status: DC | PRN
  Administered 2015-12-19: 10 mL

## 2015-12-19 MED ORDER — ONDANSETRON HCL 4 MG/2ML IJ SOLN
INTRAMUSCULAR | Status: DC | PRN
  Administered 2015-12-19: 8 mg via INTRAVENOUS

## 2015-12-19 SURGICAL SUPPLY — 30 items
BAG COUNTER SPONGE EZ (MISCELLANEOUS) ×2 IMPLANT
CANISTER SUCT 3000ML (MISCELLANEOUS) ×3 IMPLANT
CATH KIT ON-Q SILVERSOAK 5IN (CATHETERS) ×6 IMPLANT
CHLORAPREP W/TINT 26ML (MISCELLANEOUS) ×6 IMPLANT
CLOSURE WOUND 1/2 X4 (GAUZE/BANDAGES/DRESSINGS) ×1
COUNTER SPONGE BAG EZ (MISCELLANEOUS) ×1
DRSG OPSITE POSTOP 4X10 (GAUZE/BANDAGES/DRESSINGS) ×3 IMPLANT
DRSG TELFA 3X8 NADH (GAUZE/BANDAGES/DRESSINGS) ×3 IMPLANT
ELECT CAUTERY BLADE 6.4 (BLADE) ×3 IMPLANT
ELECT REM PT RETURN 9FT ADLT (ELECTROSURGICAL) ×3
ELECTRODE REM PT RTRN 9FT ADLT (ELECTROSURGICAL) ×1 IMPLANT
GAUZE SPONGE 4X4 12PLY STRL (GAUZE/BANDAGES/DRESSINGS) ×3 IMPLANT
GLOVE BIO SURGEON STRL SZ7 (GLOVE) ×9 IMPLANT
GLOVE INDICATOR 7.5 STRL GRN (GLOVE) ×9 IMPLANT
GOWN STRL REUS W/ TWL LRG LVL3 (GOWN DISPOSABLE) ×4 IMPLANT
GOWN STRL REUS W/TWL LRG LVL3 (GOWN DISPOSABLE) ×8
LIQUID BAND (GAUZE/BANDAGES/DRESSINGS) ×9 IMPLANT
NS IRRIG 1000ML POUR BTL (IV SOLUTION) ×3 IMPLANT
PACK C SECTION AR (MISCELLANEOUS) ×3 IMPLANT
PAD OB MATERNITY 4.3X12.25 (PERSONAL CARE ITEMS) ×3 IMPLANT
PAD PREP 24X41 OB/GYN DISP (PERSONAL CARE ITEMS) ×3 IMPLANT
SPONGE LAP 18X18 5 PK (GAUZE/BANDAGES/DRESSINGS) ×3 IMPLANT
STRIP CLOSURE SKIN 1/2X4 (GAUZE/BANDAGES/DRESSINGS) ×2 IMPLANT
SUT MNCRL AB 4-0 PS2 18 (SUTURE) ×3 IMPLANT
SUT PDS AB 1 TP1 96 (SUTURE) ×3 IMPLANT
SUT VIC AB 0 CTX 36 (SUTURE) ×4
SUT VIC AB 0 CTX36XBRD ANBCTRL (SUTURE) ×2 IMPLANT
SUT VIC AB 2-0 CT1 27 (SUTURE) ×2
SUT VIC AB 2-0 CT1 36 (SUTURE) ×6 IMPLANT
SUT VIC AB 2-0 CT1 TAPERPNT 27 (SUTURE) ×1 IMPLANT

## 2015-12-19 NOTE — Discharge Summary (Signed)
Obstetric Discharge Summary Reason for Admission: cesarean section Prenatal Procedures: none Intrapartum Procedures: cesarean: low cervical, transverse Postpartum Procedures: none Complications-Operative and Postpartum: none Hemoglobin  Date Value Ref Range Status  12/20/2015 11.1 (L) 12.0 - 16.0 g/dL Final   HGB  Date Value Ref Range Status  02/20/2014 8.2 (L) 12.0 - 16.0 g/dL Final   HCT  Date Value Ref Range Status  12/20/2015 31.2 (L) 35.0 - 47.0 % Final  02/20/2014 23.7 (L) 35.0 - 47.0 % Final    Physical Exam:  BP 118/68 (BP Location: Right Arm)   Pulse 78   Temp 98.4 F (36.9 C) (Oral)   Resp 18   SpO2 100%   Breastfeeding? Unknown   General: alert, appears stated age and no distress Lochia: appropriate Uterine Fundus: firm Incision: healing well DVT Evaluation: No evidence of DVT seen on physical exam.  Hospital Course: Admitted for schedule repeat cesarean delivery, which occurred without incident.  Patient had a routine post-operative/post-partum course and was meeting discharge goals on POD#2.  She was ambulating, tolerating PO, pain well-controlled on PO meds, and was voiding spontaneously. She had stable and normal vitals signs.  Discharge Diagnoses: Term Pregnancy-delivered  Discharge Information: Date: 12/21/2015 Activity: pelvic rest and no lifting greater than 10lbs x 6 weeks, no driving for 2 weeks, showers no baths Diet: routine   Medication List    TAKE these medications   ibuprofen 600 MG tablet Commonly known as:  ADVIL,MOTRIN Take 1 tablet (600 mg total) by mouth every 6 (six) hours.   multivitamin-prenatal 27-0.8 MG Tabs tablet Take 1 tablet by mouth daily at 12 noon.   oxyCODONE-acetaminophen 5-325 MG tablet Commonly known as:  PERCOCET/ROXICET Take 2 tablets by mouth every 4 (four) hours as needed.   PRILOSEC OTC PO Take 1 capsule by mouth daily.       Condition: stable Discharge to: home Follow-up Information    Dorthula Nettles, MD In 1 week.   Specialty:  Obstetrics and Gynecology Why:  For wound re-check Contact information: [place] [address] [phone]           Newborn Data: Live born female  Birth Weight: 8 lb 1.8 oz (3680 g) APGAR: 8, 9  Home with mother.  Will Bonnet, MD 12/21/2015, 11:16 AM

## 2015-12-19 NOTE — Anesthesia Procedure Notes (Signed)
Spinal  Patient location during procedure: OB Start time: 12/19/2015 7:50 AM End time: 12/19/2015 8:01 AM Staffing Anesthesiologist: Emmie Niemann Performed: anesthesiologist  Preanesthetic Checklist Completed: patient identified, site marked, surgical consent, pre-op evaluation, timeout performed, IV checked, risks and benefits discussed and monitors and equipment checked Spinal Block Patient position: sitting Prep: ChloraPrep Patient monitoring: heart rate, continuous pulse ox, blood pressure and cardiac monitor Approach: midline Location: L4-5 Injection technique: single-shot Needle Needle type: Whitacre and Introducer  Needle gauge: 24 G Needle length: 9 cm Assessment Sensory level: T3 Additional Notes Negative paresthesia. Negative blood return. Positive free-flowing CSF. Expiration date of kit checked and confirmed. Patient tolerated procedure well, without complications.

## 2015-12-19 NOTE — H&P (Signed)
Date of Initial H&P: 12/18/2015  History reviewed, patient examined, no change in status, stable for surgery.

## 2015-12-19 NOTE — Anesthesia Preprocedure Evaluation (Signed)
Anesthesia Evaluation  Patient identified by MRN, date of birth, ID band Patient awake    Reviewed: Allergy & Precautions, NPO status , Patient's Chart, lab work & pertinent test results  History of Anesthesia Complications (+) PONV  Airway Mallampati: I  TM Distance: >3 FB Neck ROM: Full    Dental no notable dental hx.    Pulmonary neg pulmonary ROS, neg sleep apnea, neg COPD,    breath sounds clear to auscultation- rhonchi (-) wheezing      Cardiovascular Exercise Tolerance: Good (-) hypertension(-) CAD and (-) Past MI  Rhythm:Regular Rate:Normal - Systolic murmurs and - Diastolic murmurs    Neuro/Psych negative neurological ROS  negative psych ROS   GI/Hepatic Neg liver ROS, GERD  ,  Endo/Other  negative endocrine ROSneg diabetes  Renal/GU negative Renal ROS     Musculoskeletal negative musculoskeletal ROS (+)   Abdominal (+) - obese, Gravid abdomen   Peds  Hematology negative hematology ROS (+)   Anesthesia Other Findings   Reproductive/Obstetrics (+) Pregnancy                             Anesthesia Physical Anesthesia Plan  ASA: II  Anesthesia Plan: Spinal   Post-op Pain Management:    Induction:   Airway Management Planned:   Additional Equipment:   Intra-op Plan:   Post-operative Plan:   Informed Consent: I have reviewed the patients History and Physical, chart, labs and discussed the procedure including the risks, benefits and alternatives for the proposed anesthesia with the patient or authorized representative who has indicated his/her understanding and acceptance.     Plan Discussed with: CRNA and Anesthesiologist  Anesthesia Plan Comments:         Anesthesia Quick Evaluation

## 2015-12-19 NOTE — Anesthesia Postprocedure Evaluation (Signed)
Anesthesia Post Note  Patient: Tiffany [name]  Procedure(s) Performed: Procedure(s) (LRB): CESAREAN [name] (N/A)  Patient location during evaluation: PACU Anesthesia Type: Spinal Level of consciousness: oriented and awake and alert Pain management: pain level controlled Vital Signs Assessment: post-procedure vital signs reviewed and stable Respiratory status: spontaneous breathing, respiratory function stable and nonlabored ventilation Cardiovascular status: blood pressure returned to baseline and stable Postop Assessment: no headache, no backache and spinal receding Anesthetic complications: no    Last Vitals:  Vitals:   12/19/15 0957 12/19/15 1012  BP: 120/81 118/77  Pulse: 75 70  Resp:      Last Pain: There were no vitals filed for this visit.               Tonea Leiphart

## 2015-12-19 NOTE — Transfer of Care (Signed)
Immediate Anesthesia Transfer of Care Note  Patient: Tiffany [name]  Procedure(s) Performed: Procedure(s) with comments: CESAREAN [name] (N/A) - Time of Birth 08:[birth year] Sex: female Weight: 8 lb 2 oz  Patient Location: Mother/Baby  Anesthesia Type:Spinal  Level of Consciousness: awake, alert , oriented and patient cooperative  Airway & Oxygen Therapy: Patient Spontanous Breathing  Post-op Assessment: Report given to RN, Post -op Vital signs reviewed and stable and Patient moving all extremities X 4  Post vital signs: Reviewed and stable  Last Vitals:  Vitals:   12/19/15 0605 12/19/15 0946  BP: 120/81 126/71  Pulse: 78 72  Resp:  12    Last Pain: There were no vitals filed for this visit.       Complications: No apparent anesthesia complications

## 2015-12-19 NOTE — Op Note (Signed)
Preoperative Diagnosis: 1) 35 y.o. G2P2002 at [redacted]w[redacted]d 2) History of prior cesarean section  Postoperative Diagnosis: 1) 35 y.o. [id number] at [redacted]w[redacted]d 2) History of prior cesarean section  Operation Performed: Repeat low transverse C-section via pfannenstiel skin incision  Indiciation: Prior cesarean section  Anesthesia: Spinal  Primary Surgeon: Malachy Mood, MD   Assistant: Prentice Docker MD  Preoperative Antibiotics: 2g ancef  Estimated Blood Loss: 747mL  IV Fluids: 2671mL  Urine Output:: 132mL  Drains or Tubes: Foley to gravity drainage, ON-Q catheter system  Implants: none  Specimens Removed: none  Complications: none  Intraoperative Findings:  Normal tubes ovaries and uterus.  Bladder well healed, non-adherent.  Minimal scar tissue, good thickness to lower uterine segment. Delivery resulted in the birth of a liveborn female, APGAR (1 MIN): 8   APGAR (5 MINS): 9, weight 8lbs 2oz.    Patient Condition:stable  Procedure in Detail:  Patient was taken to the operating room were she was administered regional anesthesia.  She was positioned in the supine position, prepped and draped in the  Usual sterile fashion.  Prior to proceeding with the case a time out was performed and the level of anesthetic was checked and noted to be adequate.  Utilizing the scalpel a pfannenstiel skin incision was made 2cm above the pubic symphysis utilizing the patient's pre-existing scar and carried down sharply to the the level of the rectus fascia.  The fascia was incised in the midline using the scalpel and then extended using mayo scissors.  The superior border of the rectus fascia was grasped with two Kocher clamps and the underlying rectus muscles were dissected of the fascia using blunt dissection.  The median raphae was incised using Mayo scissors.   The inferior border of the rectus fascia was dissected of the rectus muscles in a similar fashion.  The midline was identified, the peritoneum was  entered bluntly and expanded using manual tractions.  The uterus was noted to be in a none rotated position.  Next the bladder blade was placed retracting the bladder caudally.  A bladder flap was created.  The bladder reflection was grasped with a pickup, and Metzenbaum scissors were then used the undermine the bladder reflection.  The bladder flap was developed using digital dissection.  The bladder blade was replaced retracting the bladder caudally out of the operative field. A low transverse incision was scored on the lower uterine segment.  The hysterotomy was entered bluntly using the operators finger.  The hysterotomy incision was extended using manual traction.  The operators hand was placed within the hysterotomy position noting the fetus to be within the OA position.  The vertex was grasped, flexed, brought to the incision, and delivered a traumatically using a vacuum and fundal pressure.  The remainder of the body delivered with ease.  The infant was suctioned, cord was clamped and cut before handing off to the awaiting neonatologist.  The placenta was delivered using manual extraction.  The uterus was exteriorized, wiped clean of clots and debris using two moist laps.  The hysterotomy was closed using a two layer closure of 0 Vicryl, with the first being a running locked, the second a vertical imbricating.  The uterus was returned to the abdomen.  The peritoneal gutters were wiped clean of clots and debris using two moist laps.  The hysterotomy incision was re-inspected noted to be hemostatic.  The rectus muscles were re-approximated in the midline using a single 2-0 Vicryl mattress stitch.  The rectus muscles were inspected  noted to be hemostatic.  The superior border of the rectus fascia was grasped with a Kocher clamp.  The ON-Q trocars were then placed 4cm above th-e superior border of the incision and tunneled subfascially.  The introducers were removed and the catheters were threaded through the  sleeves after which the sleeves were removed.  The fascia was closed using a looped #1 PDS in a running fashion taking 1cm by 1cm bites.  The subcutaneous tissue was irrigated using warm saline, hemostasis achieved using the bovis.  The subcutaneous dead space was less 3cm and was not closed.  The skin was closed using un-dyed 2-0 vicryl retention sutures to reapproximated the skin, the a 4-0 Monocryl in a subcuticular fashion.  Sponge needle and instrument counts were corrects times two.  The patient tolerated the procedure well and was taken to the recovery room in stable condition.

## 2015-12-20 LAB — CBC
HCT: 31.2 % — ABNORMAL LOW (ref 35.0–47.0)
Hemoglobin: 11.1 g/dL — ABNORMAL LOW (ref 12.0–16.0)
MCH: 32 pg (ref 26.0–34.0)
MCHC: 35.6 g/dL (ref 32.0–36.0)
MCV: 89.9 fL (ref 80.0–100.0)
PLATELETS: 174 10*3/uL (ref 150–440)
RBC: 3.47 MIL/uL — ABNORMAL LOW (ref 3.80–5.20)
RDW: 14.1 % (ref 11.5–14.5)
WBC: 11.2 10*3/uL — ABNORMAL HIGH (ref 3.6–11.0)

## 2015-12-20 MED ORDER — WHITE PETROLATUM GEL
Status: AC
  Filled 2015-12-20: qty 5

## 2015-12-20 NOTE — Progress Notes (Signed)
  Post-operative Day 1  Subjective: up ad lib, voiding and tolerating PO  Objective: Blood pressure (!) 101/55, pulse 67, temperature 98.2 F (36.8 C), temperature source Oral, resp. rate 18, SpO2 100 %  Physical Exam:  General: alert and cooperative Lochia: appropriate Uterine Fundus: firm Incision: honeycomb dressing saturated with blood. Dressing replaced with telfa and ABD dressing. No active bleeding or discharge noted from incision. DVT Evaluation: No evidence of DVT seen on physical exam. Abdomen: soft, NT   Recent Labs  12/18/15 1114 12/20/15 0648  HGB 13.2 11.1*  HCT 36.6 31.2*    Assessment POD #1  Plan: Continue PO care and Advance activity as tolerated  Feeding: breast Contraception: IUD Blood Type: O+ TDAP UTD    Burlene Arnt, [place] 12/20/2015, 1:30 PM

## 2015-12-21 MED ORDER — KETOROLAC TROMETHAMINE 30 MG/ML IJ SOLN: 30.0000 mg | Freq: Four times a day (QID) | INTRAMUSCULAR | Status: DC

## 2015-12-21 MED ORDER — IBUPROFEN 600 MG PO TABS: 600.0000 mg | ORAL_TABLET | Freq: Four times a day (QID) | ORAL | 0 refills | Status: DC

## 2015-12-21 MED ORDER — OXYCODONE-ACETAMINOPHEN 5-325 MG PO TABS: 2.0000 | ORAL_TABLET | ORAL | 0 refills | Status: DC | PRN

## 2015-12-21 NOTE — Discharge Instructions (Signed)
Your baby needs to eat every 2 to 3 hours during the day, and every 4 to 5 hours during the night (8 feedings per 24 hours)  Normally newborn babies will have 6 to 8 wet diapers per day and up to 3 or 4 BM's as well.  Babies need to sleep in a crib on their back with no extra blankets, pillows, stuffed animals etc., and NEVER IN THE BED WITH OTHER CHILDREN OR ADULTS.  The umbilical cord should fall off within 1 to 2 weeks---until then please keep the area clean and dry.  There may be some oozing when it falls off (like a scab), but not any bleeding.  If it looks infected call your Pediatrician.  Reasons to call your Pediatrician:    *If your baby is running a fever greater than 99.0    *if your baby is not eating well or having enough wet/BM diapers   *if your baby ever looks yellow (jaundice)  *if your baby has any noisy/fast breathing,sounds congested,or wheezing  *if your baby looks blue or pale call 911  

## 2015-12-21 NOTE — Progress Notes (Signed)
D/C order from MD.  Reviewed d/c instructions and prescriptions with patient and answered any questions.  Patient d/c home with infant via wheelchair by nursing/auxillary. 

## 2015-12-23 ENCOUNTER — Encounter: Payer: Self-pay | Admitting: Obstetrics and Gynecology

## 2016-01-09 DIAGNOSIS — Z30430 Encounter for insertion of intrauterine contraceptive device: Secondary | ICD-10-CM | POA: Diagnosis not present

## 2016-02-04 DIAGNOSIS — H52223 Regular astigmatism, bilateral: Secondary | ICD-10-CM | POA: Diagnosis not present

## 2016-02-04 DIAGNOSIS — H5213 Myopia, bilateral: Secondary | ICD-10-CM | POA: Diagnosis not present

## 2016-02-26 DIAGNOSIS — Z30430 Encounter for insertion of intrauterine contraceptive device: Secondary | ICD-10-CM | POA: Diagnosis not present

## 2016-04-08 DIAGNOSIS — Z30431 Encounter for routine checking of intrauterine contraceptive device: Secondary | ICD-10-CM | POA: Diagnosis not present

## 2017-03-29 ENCOUNTER — Ambulatory Visit (INDEPENDENT_AMBULATORY_CARE_PROVIDER_SITE_OTHER): Payer: 59 | Admitting: Obstetrics and Gynecology

## 2017-03-29 ENCOUNTER — Encounter: Payer: Self-pay | Admitting: Obstetrics and Gynecology

## 2017-03-29 VITALS — BP 100/60 | HR 81 | Ht 60.0 in | Wt 132.0 lb

## 2017-03-29 DIAGNOSIS — Z30432 Encounter for removal of intrauterine contraceptive device: Secondary | ICD-10-CM | POA: Diagnosis not present

## 2017-03-29 DIAGNOSIS — T8332XA Displacement of intrauterine contraceptive device, initial encounter: Secondary | ICD-10-CM

## 2017-03-29 NOTE — Progress Notes (Signed)
   Chief Complaint  Patient presents with  . Contraception     History of Present Illness:  Tiffany [name] is a 36 y.o. that had a Mirena IUD placed approximately 1 years ago. Since that time, she denies dyspareunia, pelvic pain, non-menstrual bleeding, vaginal d/c, heavy bleeding. She is interested in IVF conception again.   BP 100/60   Pulse 81   Ht 5' (1.524 m)   Wt 132 lb (59.9 kg)   BMI 25.78 kg/m   Pelvic exam:  Two IUD strings absent coming from the cervical os. EGBUS, vaginal vault and cervix: within normal limits  IUD Removal Strings of IUD identified with [place] forcepts and grasped.  IUD removed without problem.  Pt tolerated this well.  IUD noted to be intact.  Assessment:  Encounter for IUD removal  Intrauterine contraceptive device threads lost, initial encounter  Plan: IUD removed and plans for conception.   Charmain Diosdado B. Pebble Botkin, PA-C 03/29/2017 4:12 PM

## 2017-03-29 NOTE — Patient Instructions (Signed)
I value your feedback and entrusting us with your care. If you get a [facility] patient survey, I would appreciate you taking the time to let us know about your experience today. Thank you! 

## 2017-04-05 DIAGNOSIS — N970 Female infertility associated with anovulation: Secondary | ICD-10-CM | POA: Diagnosis not present

## 2017-05-04 DIAGNOSIS — N970 Female infertility associated with anovulation: Secondary | ICD-10-CM | POA: Diagnosis not present

## 2017-05-15 DIAGNOSIS — Z3183 Encounter for assisted reproductive fertility procedure cycle: Secondary | ICD-10-CM | POA: Diagnosis not present

## 2017-05-21 DIAGNOSIS — Z3183 Encounter for assisted reproductive fertility procedure cycle: Secondary | ICD-10-CM | POA: Diagnosis not present

## 2017-05-29 DIAGNOSIS — Z3183 Encounter for assisted reproductive fertility procedure cycle: Secondary | ICD-10-CM | POA: Diagnosis not present

## 2017-06-03 DIAGNOSIS — Z3183 Encounter for assisted reproductive fertility procedure cycle: Secondary | ICD-10-CM | POA: Diagnosis not present

## 2017-06-12 DIAGNOSIS — Z3200 Encounter for pregnancy test, result unknown: Secondary | ICD-10-CM | POA: Diagnosis not present

## 2017-06-15 DIAGNOSIS — Z3201 Encounter for pregnancy test, result positive: Secondary | ICD-10-CM | POA: Diagnosis not present

## 2017-07-01 DIAGNOSIS — O09819 Supervision of pregnancy resulting from assisted reproductive technology, unspecified trimester: Secondary | ICD-10-CM | POA: Diagnosis not present

## 2017-07-13 DIAGNOSIS — Z3201 Encounter for pregnancy test, result positive: Secondary | ICD-10-CM | POA: Diagnosis not present

## 2017-07-28 ENCOUNTER — Encounter: Payer: Self-pay | Admitting: Advanced Practice Midwife

## 2017-07-28 ENCOUNTER — Ambulatory Visit (INDEPENDENT_AMBULATORY_CARE_PROVIDER_SITE_OTHER): Payer: 59 | Admitting: Advanced Practice Midwife

## 2017-07-28 VITALS — BP 114/62 | HR 92 | Wt 140.0 lb

## 2017-07-28 DIAGNOSIS — O0990 Supervision of high risk pregnancy, unspecified, unspecified trimester: Secondary | ICD-10-CM | POA: Diagnosis not present

## 2017-07-28 DIAGNOSIS — Z113 Encounter for screening for infections with a predominantly sexual mode of transmission: Secondary | ICD-10-CM | POA: Diagnosis not present

## 2017-07-28 DIAGNOSIS — O09819 Supervision of pregnancy resulting from assisted reproductive technology, unspecified trimester: Secondary | ICD-10-CM

## 2017-07-28 NOTE — Progress Notes (Signed)
NOB IVF [facility]

## 2017-07-28 NOTE — Progress Notes (Signed)
New Obstetric Patient H&P    Chief Complaint: "Desires prenatal care"   History of Present Illness: Patient is a 37 y.o. [id number] Not Hispanic or Latino female, presents with amenorrhea and she has been under the care of [facility] for IVF treatment. Patient's last menstrual period was 05/13/2017 (approximate). and based on her IFV dating, her EDD is Estimated Date of Delivery: 02/19/18 and her EGA is [redacted]w[redacted]d. Cycles are 6. days . She had 1 cycle after having her IUD removed prior to conception. Her last pap smear was 2 years ago and was no abnormalities.    She has been under the care of [facility]. Transfer of embryo was 06/03/17. She had TVUS on 2/14 dating of [redacted]w[redacted]d and f/u scan on 2/26 dating [redacted]w[redacted]d. Since her LMP she claims she has experienced breast tenderness, fatigue, nausea. She denies vaginal bleeding. Her past medical history is noncontributory. Her prior pregnancies are notable for elevated blood pressure postpartum with G1, C/S for arrest of dilation. Normotensive with G2 with repeat C/S.  Since her LMP, she admits to the use of tobacco products  no She claims she has gained   10 pounds since the start of her pregnancy.  There are cats in the home in the home  no  She admits close contact with children on a regular basis  yes  She has had chicken pox in the past yes She has had Tuberculosis exposures, symptoms, or previously tested positive for TB   no Current or past history of domestic violence. no  Genetic Screening/Teratology Counseling: (Includes patient, baby's father, or anyone in either family with:)   56. Patient's age >/= 49 [facility]  yes 2. Thalassemia ([place], [place], [place], or Asian background): MCV<80  no 3. Neural tube defect (meningomyelocele, spina bifida, anencephaly)  no 4. Congenital heart defect  no  5. Down syndrome  no 6. Tay-Sachs (Jewish, [place])  no 7. Canavan's Disease  no 8. Sickle cell disease or trait (African)  no    9. Hemophilia or other blood disorders  no  10. Muscular dystrophy  no  11. Cystic fibrosis  no  12. Huntington's Chorea  no  13. Mental retardation/autism  no 14. Other inherited genetic or chromosomal disorder  no 15. Maternal metabolic disorder (DM, PKU, etc)  no 16. Patient or FOB with a child with a birth defect not listed above no  16a. Patient or FOB with a birth defect themselves no 17. Recurrent pregnancy loss, or stillbirth  no  18. Any medications since LMP other than prenatal vitamins (include vitamins, supplements, OTC meds, drugs, alcohol)  no 19. Any other genetic/environmental exposure to discuss  no  Infection History:   1. Lives with someone with TB or TB exposed  no  2. Patient or partner has history of genital herpes  no 3. Rash or viral illness since LMP  no 4. History of STI (GC, CT, HPV, syphilis, HIV)  no 5. History of recent travel :  no  Other pertinent information:  no    Review of Systems:10 point review of systems negative unless otherwise noted in HPI  Past Medical History:  Past Medical History:  Diagnosis Date  . GERD (gastroesophageal reflux disease)    during pregnancy  . Newborn product of in vitro fertilization (IVF) pregnancy   . PONV (postoperative nausea and vomiting)    nausea with laparoscopy    Past Surgical History:  Past Surgical History:  Procedure Laterality Date  .  CESAREAN SECTION  02/2014  . CESAREAN SECTION N/A 12/19/2015   Procedure: CESAREAN SECTION;  Surgeon: Malachy Mood, MD;  Location: [facility] ORS;  Service: Obstetrics;  Laterality: N/A;  Time of Birth 08:24 Sex: female Weight: 8 lb 2 oz  . diagnoastic lap  2013  . DILATION AND CURETTAGE OF UTERUS    . endometriosis     history of  . HERNIA REPAIR Right 1984  . INTRAUTERINE DEVICE (IUD) INSERTION  2015  . IUD REMOVAL  2016    Gynecologic History: Patient's last menstrual period was 05/13/2017 (approximate).  Obstetric History: [id number]  Family History:   History reviewed. No pertinent family history.  Social History:  Social History   Socioeconomic History  . Marital status: Married    Spouse name: Not on file  . Number of children: Not on file  . Years of education: Not on file  . Highest education level: Not on file  Social Needs  . Financial resource strain: Not on file  . Food insecurity - worry: Not on file  . Food insecurity - inability: Not on file  . Transportation needs - medical: Not on file  . Transportation needs - non-medical: Not on file  Occupational History  . Not on file  Tobacco Use  . Smoking status: Never Smoker  . Smokeless tobacco: Never Used  Substance and Sexual Activity  . Alcohol use: No  . Drug use: No  . Sexual activity: Yes    Birth control/protection: IUD  Other Topics Concern  . Not on file  Social History Narrative  . Not on file    Allergies:  Allergies  Allergen Reactions  . Zantac [Ranitidine Hcl] Anaphylaxis  . Penicillins Rash    Has patient had a PCN reaction causing immediate rash, facial/tongue/throat swelling, SOB or lightheadedness with hypotension: Yes Has patient had a PCN reaction causing severe rash involving mucus membranes or skin necrosis: Yes Has patient had a PCN reaction that required hospitalization No Has patient had a PCN reaction occurring within the last 10 years: No If all of the above answers are "NO", then may proceed with Cephalosporin use.   . Procardia [Nifedipine] Rash    Medications: Prior to Admission medications   Medication Sig Start Date End Date Taking? Authorizing Provider  Omeprazole Magnesium (PRILOSEC OTC PO) Take 1 capsule by mouth daily.   Yes [provider]  Prenatal Vit-Fe Fumarate-FA (MULTIVITAMIN-PRENATAL) 27-0.8 MG TABS tablet Take 1 tablet by mouth daily at 12 noon.   Yes [provider]    Physical Exam Vitals: Blood pressure 114/62, pulse 92, weight 140 lb (63.5 kg), last menstrual period  05/13/2017  General: NAD HEENT: normocephalic, anicteric Thyroid: no enlargement, no palpable nodules Pulmonary: No increased work of breathing, CTAB Cardiovascular: RRR, distal pulses 2+ Abdomen: NABS, soft, non-tender, non-distended.  Umbilicus without lesions.  No hepatomegaly, splenomegaly or masses palpable. No evidence of hernia  Genitourinary:  External: Normal external female genitalia.  Normal urethral meatus, normal  Bartholin's and Skene's glands.    Vagina: Normal vaginal mucosa, no evidence of prolapse.    Cervix: Grossly normal in appearance, no bleeding, no CMT  Uterus: deferred for no concerns/shared decision making    Adnexa: deferred for no concerns/shared decision making  Rectal: deferred Extremities: no edema, erythema, or tenderness Neurologic: Grossly intact Psychiatric: mood appropriate, affect full   Assessment: 37 y.o. G3P2002 at [redacted]w[redacted]d presenting to initiate prenatal care  Plan: 1) Avoid alcoholic beverages. 2) Patient encouraged not to smoke.  3) Discontinue the use of all non-medicinal drugs and chemicals.  4) Take prenatal vitamins daily.  5) Nutrition, food safety (fish, cheese advisories, and high nitrite foods) and exercise discussed. 6) Hospital and practice style discussed with cross coverage system.  7) Genetic Screening, such as with 1st Trimester Screening, cell free fetal DNA, AFP testing, and Ultrasound, as well as with amniocentesis and CVS as appropriate, is discussed with patient. At the conclusion of today's visit patient requested genetic testing 8) Patient is asked about travel to areas at risk for the Zika virus, and counseled to avoid travel and exposure to mosquitoes or sexual partners who may have themselves been exposed to the virus. Testing is discussed, and will be ordered as appropriate.    Rod Can, [facility], [facility] 07/28/2017, 4:34 PM

## 2017-07-28 NOTE — Patient Instructions (Signed)
Prenatal Care WHAT IS PRENATAL CARE? Prenatal care is the process of caring for a pregnant woman before she gives birth. Prenatal care makes sure that she and her baby remain as healthy as possible throughout pregnancy. Prenatal care may be provided by a midwife, family practice health care provider, or a childbirth and pregnancy specialist (obstetrician). Prenatal care may include physical examinations, testing, treatments, and education on nutrition, lifestyle, and social support services. WHY IS PRENATAL CARE SO IMPORTANT? Early and consistent prenatal care increases the chance that you and your baby will remain healthy throughout your pregnancy. This type of care also decreases a baby's risk of being born too early (prematurely), or being born smaller than expected (small for gestational age). Any underlying medical conditions you may have that could pose a risk during your pregnancy are discussed during prenatal care visits. You will also be monitored regularly for any new conditions that may arise during your pregnancy so they can be treated quickly and effectively. WHAT HAPPENS DURING PRENATAL CARE VISITS? Prenatal care visits may include the following: Discussion Tell your health care provider about any new signs or symptoms you have experienced since your last visit. These might include:  Nausea or vomiting.  Increased or decreased level of energy.  Difficulty sleeping.  Back or leg pain.  Weight changes.  Frequent urination.  Shortness of breath with physical activity.  Changes in your skin, such as the development of a rash or itchiness.  Vaginal discharge or bleeding.  Feelings of excitement or nervousness.  Changes in your baby's movements.  You may want to write down any questions or topics you want to discuss with your health care provider and bring them with you to your appointment. Examination During your first prenatal care visit, you will likely have a complete  physical exam. Your health care provider will often examine your vagina, cervix, and the position of your uterus, as well as check your heart, lungs, and other body systems. As your pregnancy progresses, your health care provider will measure the size of your uterus and your baby's position inside your uterus. He or she may also examine you for early signs of labor. Your prenatal visits may also include checking your blood pressure and, after about 10-12 weeks of pregnancy, listening to your baby's heartbeat. Testing Regular testing often includes:  Urinalysis. This checks your urine for glucose, protein, or signs of infection.  Blood count. This checks the levels of white and red blood cells in your body.  Tests for sexually transmitted infections (STIs). Testing for STIs at the beginning of pregnancy is routinely done and is required in many states.  Antibody testing. You will be checked to see if you are immune to certain illnesses, such as rubella, that can affect a developing fetus.  Glucose screen. Around 24-28 weeks of pregnancy, your blood glucose level will be checked for signs of gestational diabetes. Follow-up tests may be recommended.  Group B strep. This is a bacteria that is commonly found inside a woman's vagina. This test will inform your health care provider if you need an antibiotic to reduce the amount of this bacteria in your body prior to labor and childbirth.  Ultrasound. Many pregnant women undergo an ultrasound screening around 18-20 weeks of pregnancy to evaluate the health of the fetus and check for any developmental abnormalities.  HIV (human immunodeficiency virus) testing. Early in your pregnancy, you will be screened for HIV. If you are at high risk for HIV, this test may   be repeated during your third trimester of pregnancy.  You may be offered other testing based on your age, personal or family medical history, or other factors. HOW OFTEN SHOULD I PLAN TO SEE MY  HEALTH CARE PROVIDER FOR PRENATAL CARE? Your prenatal care check-up schedule depends on any medical conditions you have before, or develop during, your pregnancy. If you do not have any underlying medical conditions, you will likely be seen for checkups:  Monthly, during the first 6 months of pregnancy.  Twice a month during months 7 and 8 of pregnancy.  Weekly starting in the 9th month of pregnancy and until delivery.  If you develop signs of early labor or other concerning signs or symptoms, you may need to see your health care provider more often. Ask your health care provider what prenatal care schedule is best for you. WHAT CAN I DO TO KEEP MYSELF AND MY BABY AS HEALTHY AS POSSIBLE DURING MY PREGNANCY?  Take a prenatal vitamin containing 400 micrograms (0.4 mg) of folic acid every day. Your health care provider may also ask you to take additional vitamins such as iodine, vitamin D, iron, copper, and zinc.  Take 1500-2000 mg of calcium daily starting at your 20th week of pregnancy until you deliver your baby.  Make sure you are up to date on your vaccinations. Unless directed otherwise by your health care provider: ? You should receive a tetanus, diphtheria, and pertussis (Tdap) vaccination between the 27th and 36th week of your pregnancy, regardless of when your last Tdap immunization occurred. This helps protect your baby from whooping cough (pertussis) after he or she is born. ? You should receive an annual inactivated influenza vaccine (IIV) to help protect you and your baby from influenza. This can be done at any point during your pregnancy.  Eat a well-rounded diet that includes: ? Fresh fruits and vegetables. ? Lean proteins. ? Calcium-rich foods such as milk, yogurt, hard cheeses, and dark, leafy greens. ? Whole grain breads.  Do noteat seafood high in mercury, including: ? Swordfish. ? Tilefish. ? Shark. ? King mackerel. ? More than 6 oz tuna per week.  Do not  eat: ? Raw or undercooked meats or eggs. ? Unpasteurized foods, such as soft cheeses (brie, blue, or feta), juices, and milks. ? Lunch meats. ? Hot dogs that have not been heated until they are steaming.  Drink enough water to keep your urine clear or pale yellow. For many women, this may be 10 or more 8 oz glasses of water each day. Keeping yourself hydrated helps deliver nutrients to your baby and may prevent the start of pre-term uterine contractions.  Do not use any tobacco products including cigarettes, chewing tobacco, or electronic cigarettes. If you need help quitting, ask your health care provider.  Do not drink beverages containing alcohol. No safe level of alcohol consumption during pregnancy has been determined.  Do not use any illegal drugs. These can harm your developing baby or cause a miscarriage.  Ask your health care provider or pharmacist before taking any prescription or over-the-counter medicines, herbs, or supplements.  Limit your caffeine intake to no more than 200 mg per day.  Exercise. Unless told otherwise by your health care provider, try to get 30 minutes of moderate exercise most days of the week. Do not  do high-impact activities, contact sports, or activities with a high risk of falling, such as horseback riding or downhill skiing.  Get plenty of rest.  Avoid anything that raises your   body temperature, such as hot tubs and saunas.  If you own a cat, do not empty its litter box. Bacteria contained in cat feces can cause an infection called toxoplasmosis. This can result in serious harm to the fetus.  Stay away from chemicals such as insecticides, lead, mercury, and cleaning or paint products that contain solvents.  Do not have any X-rays taken unless medically necessary.  Take a childbirth and breastfeeding preparation class. Ask your health care provider if you need a referral or recommendation.  This information is not intended to replace advice given  to you by your health care provider. Make sure you discuss any questions you have with your health care provider. Document Released: 05/07/2003 Document Revised: 10/07/2015 Document Reviewed: 07/19/2013 Elsevier Interactive Patient Education  2017 [employer] Exercise During Pregnancy For people of all ages, exercise is an important part of being healthy. Exercise improves heart and lung function and helps to maintain strength, flexibility, and a healthy body weight. Exercise also boosts energy levels and elevates mood. For most women, maintaining an exercise routine throughout pregnancy is recommended. It is only on rare occasions and with certain medical conditions or pregnancy complications that women may be asked to limit or avoid exercise during pregnancy. What are some other benefits to exercising during pregnancy? Along with maintaining strength and flexibility, exercising throughout pregnancy can help to:  Keep strength in muscles that are very important during labor and childbirth.  Decrease low back pain during pregnancy.  Decrease the risk of developing gestational diabetes mellitus (GDM).  Improve blood sugar (glucose) control for women who have GDM.  Decrease the risk of developing preeclampsia. This is a serious condition that causes high blood pressure along with other symptoms, such as swelling and headaches.  Decrease the risk of cesarean delivery.  Speed up the recovery after giving birth.  How often should I exercise? Unless your health care provider gives you different instructions, you should try to exercise on most days or all days of the week. In general, try to exercise with moderate intensity for about 150 minutes per week. This can be spread out across several days, such as exercising for 30 minutes per day on 5 days of each week. You can tell that you are exercising at a moderate intensity if you have a higher heart rate and faster breathing, but you are still  able to hold a conversation. What types of moderate-intensity exercise are recommended during pregnancy? There are many types of exercise that are safe for you to do during pregnancy. Unless your health care provider gives you different instructions, do a variety of exercises that safely increase your heart and breathing (cardiopulmonary) rates and help you to build and maintain muscle strength (strength training). You should always be able to talk in full sentences while exercising during pregnancy. Some examples of exercising that is safe to do during pregnancy include:  Brisk walking or hiking.  Swimming.  Water aerobics.  Riding a stationary bike.  Strength training.  Modified yoga or Pilates. Tell your instructor that you are pregnant. Avoid overstretching and avoid lying on your back for long periods of time.  Running or jogging. Only choose this type of exercise if: ? You ran or jogged regularly before your pregnancy. ? You can run or jog and still talk in complete sentences.  What types of exercise should I not do during pregnancy? Depending on your level of fitness and whether you exercised regularly before your pregnancy, you may be   advised to limit vigorous-intensity exercise during your pregnancy. You can tell that you are exercising at a vigorous intensity if you are breathing much harder and faster and cannot hold a conversation while exercising. Some examples of exercising that you should avoid during pregnancy include:  Contact sports.  Activities that place you at risk for falling on or being hit in the belly, such as downhill skiing, water skiing, surfing, rock climbing, cycling, gymnastics, and horseback riding.  Scuba diving.  Sky diving.  Yoga or Pilates in a room that is heated to extreme temperatures ("hot yoga" or "hot Pilates").  Jogging or running, unless you ran or jogged regularly before your pregnancy. While jogging or running, you should always be able  to talk in full sentences. Do not run or jog so vigorously that you are unable to have a conversation.  If you are not used to exercising at elevation (more than 6,000 feet above sea level), do not do so during your pregnancy.  When should I avoid exercising during pregnancy? Certain medical conditions can make it unsafe to exercise during pregnancy, or they may increase your risk of miscarriage or early labor and birth. Some of these conditions include:  Some types of heart disease.  Some types of lung disease.  Placenta previa. This is when the placenta partially or completely covers the opening of the uterus (cervix).  Frequent bleeding from the vagina during your pregnancy.  Incompetent cervix. This is when your cervix does not remain as tightly closed during pregnancy as it should.  Premature labor.  Ruptured membranes. This is when the protective sac (amniotic sac) opens up and amniotic fluid leaks from your vagina.  Severely low blood count (anemia).  Preeclampsia or pregnancy-caused high blood pressure.  Carrying more than one baby (multiple gestation) and having an additional risk of early labor.  Poorly controlled diabetes.  Being severely underweight or severely overweight.  Intrauterine growth restriction. This is when your baby's growth and development during pregnancy are slower than expected.  Other medical conditions. Ask your health care provider if any apply to you.  What else should I know about exercising during pregnancy? You should take these precautions while exercising during pregnancy:  Avoid overheating. ? Wear loose-fitting, breathable clothes. ? Do not exercise in very high temperatures.  Avoid dehydration. Drink enough water before, during, and after exercise to keep your urine clear or pale yellow.  Avoid overstretching. Because of hormone changes during pregnancy, it is easy to overstretch muscles, tendons, and ligaments during  pregnancy.  Start slowly and ask your health care provider to recommend types of exercise that are safe for you, if exercising regularly is new for you.  Pregnancy is not a time for exercising to lose weight. When should I seek medical care? You should stop exercising and call your health care provider if you have any unusual symptoms, such as:  Mild uterine contractions or abdominal cramping.  Dizziness that does not improve with rest.  When should I seek immediate medical care? You should stop exercising and call your local emergency services (911 in the [place] if you have any unusual symptoms, such as:  Sudden, severe pain in your low back or your belly.  Uterine contractions or abdominal cramping that do not improve with rest.  Chest pain.  Bleeding or fluid leaking from your vagina.  Shortness of breath.  This information is not intended to replace advice given to you by your health care provider. Make sure you discuss any   questions you have with your health care provider. Document Released: 05/04/2005 Document Revised: 10/02/2015 Document Reviewed: 07/12/2014 [employer] Interactive Patient Education  2018 [employer] Eating Plan for Pregnant Women While you are pregnant, your body will require additional nutrition to help support your growing baby. It is recommended that you consume:  150 additional calories each day during your first trimester.  300 additional calories each day during your second trimester.  300 additional calories each day during your third trimester.  Eating a healthy, well-balanced diet is very important for your health and for your baby's health. You also have a higher need for some vitamins and minerals, such as folic acid, calcium, iron, and vitamin D. What do I need to know about eating during pregnancy?  Do not try to lose weight or go on a diet during pregnancy.  Choose healthy, nutritious foods. Choose  of a sandwich with a glass of milk  instead of a candy bar or a high-calorie sugar-sweetened beverage.  Limit your overall intake of foods that have "empty calories." These are foods that have little nutritional value, such as sweets, desserts, candies, sugar-sweetened beverages, and fried foods.  Eat a variety of foods, especially fruits and vegetables.  Take a prenatal vitamin to help meet the additional needs during pregnancy, specifically for folic acid, iron, calcium, and vitamin D.  Remember to stay active. Ask your health care provider for exercise recommendations that are specific to you.  Practice good food safety and cleanliness, such as washing your hands before you eat and after you prepare raw meat. This helps to prevent foodborne illnesses, such as listeriosis, that can be very dangerous for your baby. Ask your health care provider for more information about listeriosis. What does 150 extra calories look like? Healthy options for an additional 150 calories each day could be any of the following:  Plain low-fat yogurt (6-8 oz) with  cup of berries.  1 apple with 2 teaspoons of peanut butter.  Cut-up vegetables with  cup of hummus.  Low-fat chocolate milk (8 oz or 1 cup).  1 string cheese with 1 medium orange.   of a peanut butter and jelly sandwich on whole-wheat bread (1 tsp of peanut butter).  For 300 calories, you could eat two of those healthy options each day. What is a healthy amount of weight to gain? The recommended amount of weight for you to gain is based on your pre-pregnancy BMI. If your pre-pregnancy BMI was:  Less than 18 (underweight), you should gain 28-40 lb.  18-24.9 (normal), you should gain 25-35 lb.  25-29.9 (overweight), you should gain 15-25 lb.  Greater than 30 (obese), you should gain 11-20 lb.  What if I am having twins or multiples? Generally, pregnant women who will be having twins or multiples may need to increase their daily calories by 300-600 calories each day. The  recommended range for total weight gain is 25-54 lb, depending on your pre-pregnancy BMI. Talk with your health care provider for specific guidance about additional nutritional needs, weight gain, and exercise during your pregnancy. What foods can I eat? Grains Any grains. Try to choose whole grains, such as whole-wheat bread, oatmeal, or brown rice. Vegetables Any vegetables. Try to eat a variety of colors and types of vegetables to get a full range of vitamins and minerals. Remember to wash your vegetables well before eating. Fruits Any fruits. Try to eat a variety of colors and types of fruit to get a full range of vitamins and   minerals. Remember to wash your fruits well before eating. Meats and Other Protein Sources Lean meats, including chicken, turkey, fish, and lean cuts of beef, veal, or pork. Make sure that all meats are cooked to "well done." Tofu. Tempeh. Beans. Eggs. Peanut butter and other nut butters. Seafood, such as shrimp, crab, and lobster. If you choose fish, select types that are higher in omega-3 fatty acids, including salmon, herring, mussels, trout, sardines, and pollock. Make sure that all meats are cooked to food-safe temperatures. Dairy Pasteurized milk and milk alternatives. Pasteurized yogurt and pasteurized cheese. Cottage cheese. Sour cream. Beverages Water. Juices that contain 100% fruit juice or vegetable juice. Caffeine-free teas and decaffeinated coffee. Drinks that contain caffeine are okay to drink, but it is better to avoid caffeine. Keep your total caffeine intake to less than 200 mg each day (12 oz of coffee, tea, or soda) or as directed by your health care provider. Condiments Any pasteurized condiments. Sweets and Desserts Any sweets and desserts. Fats and Oils Any fats and oils. The items listed above may not be a complete list of recommended foods or beverages. Contact your dietitian for more options. What foods are not  recommended? Vegetables Unpasteurized (raw) vegetable juices. Fruits Unpasteurized (raw) fruit juices. Meats and Other Protein Sources Cured meats that have nitrates, such as bacon, salami, and hotdogs. Luncheon meats, bologna, or other deli meats (unless they are reheated until they are steaming hot). Refrigerated pate, meat spreads from a meat counter, smoked seafood that is found in the refrigerated section of a store. Raw fish, such as sushi or sashimi. High mercury content fish, such as tilefish, shark, swordfish, and king mackerel. Raw meats, such as tuna or beef tartare. Undercooked meats and poultry. Make sure that all meats are cooked to food-safe temperatures. Dairy Unpasteurized (raw) milk and any foods that have raw milk in them. Soft cheeses, such as feta, queso blanco, queso fresco, Brie, Camembert cheeses, blue-veined cheeses, and Panela cheese (unless it is made with pasteurized milk, which must be stated on the label). Beverages Alcohol. Sugar-sweetened beverages, such as sodas, teas, or energy drinks. Condiments Homemade fermented foods and drinks, such as pickles, sauerkraut, or kombucha drinks. (Store-bought pasteurized versions of these are okay.) Other Salads that are made in the store, such as ham salad, chicken salad, egg salad, tuna salad, and seafood salad. The items listed above may not be a complete list of foods and beverages to avoid. Contact your dietitian for more information. This information is not intended to replace advice given to you by your health care provider. Make sure you discuss any questions you have with your health care provider. Document Released: 02/16/2014 Document Revised: 10/10/2015 Document Reviewed: 10/17/2013 [employer] Interactive Patient Education  2018 [employer]  

## 2017-07-29 LAB — RPR+RH+ABO+RUB AB+AB SCR+CB...
ANTIBODY SCREEN: NEGATIVE
HIV Screen 4th Generation wRfx: NONREACTIVE
Hematocrit: 37.6 % (ref 34.0–46.6)
Hemoglobin: 12.8 g/dL (ref 11.1–15.9)
Hepatitis B Surface Ag: NEGATIVE
MCH: 31.1 pg (ref 26.6–33.0)
MCHC: 34 g/dL (ref 31.5–35.7)
MCV: 91 fL (ref 79–97)
PLATELETS: 220 10*3/uL (ref 150–379)
RBC: 4.12 x10E6/uL (ref 3.77–5.28)
RDW: 12.5 % (ref 12.3–15.4)
RPR Ser Ql: NONREACTIVE
RUBELLA: 1.01 {index} (ref >0.99)
Rh Factor: POSITIVE
VARICELLA: 316 {index} (ref >165)
WBC: 7.4 10*3/uL (ref 3.4–10.8)

## 2017-07-29 LAB — URINE CULTURE: ORGANISM ID, BACTERIA: NO GROWTH

## 2017-08-02 LAB — MATERNIT 21 PLUS CORE, BLOOD
CHROMOSOME 18: NEGATIVE
Chromosome 13: NEGATIVE
Chromosome 21: NEGATIVE
Y CHROMOSOME: NOT DETECTED

## 2017-08-03 LAB — CHLAMYDIA/GONOCOCCUS/TRICHOMONAS, NAA
CHLAMYDIA BY NAA: NEGATIVE
Gonococcus by NAA: NEGATIVE
Trich vag by NAA: NEGATIVE

## 2017-08-20 DIAGNOSIS — Z0000 Encounter for general adult medical examination without abnormal findings: Secondary | ICD-10-CM | POA: Diagnosis not present

## 2017-08-26 ENCOUNTER — Ambulatory Visit (INDEPENDENT_AMBULATORY_CARE_PROVIDER_SITE_OTHER): Payer: 59 | Admitting: Obstetrics and Gynecology

## 2017-08-26 VITALS — BP 106/68 | Wt 141.0 lb

## 2017-08-26 DIAGNOSIS — Z98891 History of uterine scar from previous surgery: Secondary | ICD-10-CM

## 2017-08-26 DIAGNOSIS — Z363 Encounter for antenatal screening for malformations: Secondary | ICD-10-CM

## 2017-08-26 DIAGNOSIS — Z3A14 14 weeks gestation of pregnancy: Secondary | ICD-10-CM

## 2017-08-26 DIAGNOSIS — O0990 Supervision of high risk pregnancy, unspecified, unspecified trimester: Secondary | ICD-10-CM

## 2017-08-26 DIAGNOSIS — O09819 Supervision of pregnancy resulting from assisted reproductive technology, unspecified trimester: Secondary | ICD-10-CM

## 2017-08-26 NOTE — Progress Notes (Signed)
ROB

## 2017-08-27 ENCOUNTER — Encounter: Payer: Self-pay | Admitting: Obstetrics and Gynecology

## 2017-08-27 DIAGNOSIS — Z98891 History of uterine scar from previous surgery: Secondary | ICD-10-CM | POA: Insufficient documentation

## 2017-08-27 NOTE — Progress Notes (Signed)
Routine Prenatal Care Visit  Subjective  Tiffany [name] is a 37 y.o. G3P2002 at [redacted]w[redacted]d being seen today for ongoing prenatal care.  She is currently monitored for the following issues for this high-risk pregnancy and has Pregnancy resulting from in vitro fertilization, antepartum; Supervision of high risk pregnancy, antepartum; and History of 2 cesarean sections on their problem list.  ----------------------------------------------------------------------------------- Patient reports no complaints.   Contractions: Not present. Vag. Bleeding: None.  Movement: Absent. Denies leaking of fluid.  ----------------------------------------------------------------------------------- The following portions of the patient's history were reviewed and updated as appropriate: allergies, current medications, past family history, past medical history, past social history, past surgical history and problem list. Problem list updated.   Objective  Blood pressure 106/68, weight 141 lb (64 kg), last menstrual period 05/13/2017, unknown if currently breastfeeding. Pregravid weight 130 lb (59 kg) Total Weight Gain 11 lb (4.99 kg) Urinalysis: Urine Protein: Negative Urine Glucose: Negative  Fetal Status: Fetal Heart Rate (bpm): 140   Movement: Absent     General:  Alert, oriented and cooperative. Patient is in no acute distress.  Skin: Skin is warm and dry. No rash noted.   Cardiovascular: Normal heart rate noted  Respiratory: Normal respiratory effort, no problems with respiration noted  Abdomen: Soft, gravid, appropriate for gestational age. Pain/Pressure: Absent     Pelvic:  Cervical exam deferred        Extremities: Normal range of motion.     ental Status: Normal mood and affect. Normal behavior. Normal judgment and thought content.     Assessment   37 y.o. [name] at [redacted]w[redacted]d by  02/19/2018, by Ultrasound presenting for routine prenatal visit  Plan   Pregnancy#3 Problems (from 05/13/17 to  present)    Problem Noted Resolved   History of 2 cesarean sections 08/27/2017 by Malachy Mood, MD No   Pregnancy resulting from in vitro fertilization, antepartum 07/28/2017 by Rod Can, CNM No   Overview Signed 08/27/2017  3:40 PM by Malachy Mood, MD    [ ]  Fetal Echo 22 weeks      Supervision of high risk pregnancy, antepartum 07/28/2017 by Rod Can, CNM No   Overview Addendum 08/27/2017  3:40 PM by Malachy Mood, MD    Clinic [facility] Prenatal Labs  Dating Embryo transfer date Blood type: O/Positive/-- (03/13 1624)   Genetic Screen NIPS: 07/28/17 normal XX Antibody:Negative (03/13 1624)  Anatomic [place]  Rubella: 1.01 (03/13 1624) Varicella: Immune  GTT  RPR: Non Reactive (03/13 1624)   Rhogam N/A HBsAg: Negative (03/13 1624)   TDaP vaccine                       Flu Shot: received at work HIV: Non Reactive (03/13 1624)   Baby Food                                GBS:   Contraception  Pap: 01/29/2016 NILM HPV negative  CBB     CS/VBAC x2 desires repeat   Support Person [name]               Gestational age appropriate obstetric precautions including but not limited to vaginal bleeding, contractions, leaking of fluid and fetal movement were reviewed in detail with the patient.   - set up fetal echo at 22 weeks - anatomy scan next visit  Return in about 1 month (around 09/23/2017) for ROB and anatomy scan.  Malachy Mood, MD, [facility], [facility] 08/27/2017, 3:45 PM

## 2017-09-03 ENCOUNTER — Other Ambulatory Visit
Admission: RE | Admit: 2017-09-03 | Discharge: 2017-09-03 | Disposition: A | Discharge status: Home / Self Care | Payer: 59 | Source: Ambulatory Visit | Attending: Family Medicine | Admitting: Family Medicine

## 2017-09-03 DIAGNOSIS — Z0000 Encounter for general adult medical examination without abnormal findings: Secondary | ICD-10-CM | POA: Diagnosis not present

## 2017-09-03 LAB — LIPID PANEL
CHOLESTEROL: 216 mg/dL — AB (ref 0–200)
HDL: 76 mg/dL (ref >40)
LDL CALC: 117 mg/dL — AB (ref 0–99)
TRIGLYCERIDES: 115 mg/dL (ref <150)
Total CHOL/HDL Ratio: 2.8 RATIO
VLDL: 23 mg/dL (ref 0–40)

## 2017-09-03 LAB — COMPREHENSIVE METABOLIC PANEL
ALK PHOS: 51 U/L (ref 38–126)
ALT: 13 U/L — ABNORMAL LOW (ref 14–54)
ANION GAP: 7 (ref 5–15)
AST: 19 U/L (ref 15–41)
Albumin: 3.5 g/dL (ref 3.5–5.0)
BUN: 12 mg/dL (ref 6–20)
CALCIUM: 8.4 mg/dL — AB (ref 8.9–10.3)
CO2: 24 mmol/L (ref 22–32)
Chloride: 104 mmol/L (ref 101–111)
Creatinine, Ser: 0.48 mg/dL (ref 0.44–1.00)
Glucose, Bld: 90 mg/dL (ref 65–99)
Potassium: 3.6 mmol/L (ref 3.5–5.1)
SODIUM: 135 mmol/L (ref 135–145)
Total Bilirubin: 1.1 mg/dL (ref 0.3–1.2)
Total Protein: 6.4 g/dL — ABNORMAL LOW (ref 6.5–8.1)

## 2017-09-20 ENCOUNTER — Ambulatory Visit (INDEPENDENT_AMBULATORY_CARE_PROVIDER_SITE_OTHER): Payer: 59 | Admitting: Obstetrics and Gynecology

## 2017-09-20 ENCOUNTER — Ambulatory Visit (INDEPENDENT_AMBULATORY_CARE_PROVIDER_SITE_OTHER): Payer: 59

## 2017-09-20 VITALS — BP 114/66 | Wt 144.0 lb

## 2017-09-20 DIAGNOSIS — Z98891 History of uterine scar from previous surgery: Secondary | ICD-10-CM

## 2017-09-20 DIAGNOSIS — O09819 Supervision of pregnancy resulting from assisted reproductive technology, unspecified trimester: Secondary | ICD-10-CM

## 2017-09-20 DIAGNOSIS — Z363 Encounter for antenatal screening for malformations: Secondary | ICD-10-CM | POA: Diagnosis not present

## 2017-09-20 DIAGNOSIS — O0990 Supervision of high risk pregnancy, unspecified, unspecified trimester: Secondary | ICD-10-CM

## 2017-09-20 DIAGNOSIS — Z3A18 18 weeks gestation of pregnancy: Secondary | ICD-10-CM

## 2017-09-20 NOTE — Progress Notes (Signed)
ROB Anatomy scan/ IT IS  A GIRL!!

## 2017-09-23 NOTE — Progress Notes (Signed)
Routine Prenatal Care Visit  Subjective  Tiffany [name] is a 37 y.o. G3P2002 at [redacted]w[redacted]d being seen today for ongoing prenatal care.  She is currently monitored for the following issues for this high-risk pregnancy and has Pregnancy resulting from in vitro fertilization, antepartum; Supervision of high risk pregnancy, antepartum; and History of 2 cesarean sections on their problem list.  ----------------------------------------------------------------------------------- Patient reports no complaints.   Contractions: Not present. Vag. Bleeding: None.  Movement: Absent. Denies leaking of fluid.  ----------------------------------------------------------------------------------- The following portions of the patient's history were reviewed and updated as appropriate: allergies, current medications, past family history, past medical history, past social history, past surgical history and problem list. Problem list updated.   Objective  Blood pressure 114/66, weight 144 lb (65.3 kg), last menstrual period 05/13/2017, unknown if currently breastfeeding. Pregravid weight 130 lb (59 kg) Total Weight Gain 14 lb (6.35 kg) Urinalysis: Urine Protein: Negative Urine Glucose: Negative  Fetal Status: Fetal Heart Rate (bpm): 141   Movement: Absent     General:  Alert, oriented and cooperative. Patient is in no acute distress.  Skin: Skin is warm and dry. No rash noted.   Cardiovascular: Normal heart rate noted  Respiratory: Normal respiratory effort, no problems with respiration noted  Abdomen: Soft, gravid, appropriate for gestational age. Pain/Pressure: Absent     Pelvic:  Cervical exam deferred        Extremities: Normal range of motion.     ental Status: Normal [name] and affect. Normal behavior. Normal judgment and thought content.   US Ob Comp + 14 Wk  Result Date: 09/20/2017 ULTRASOUND REPORT Patient Name: Tiffany [name] DOB: [birth year] MRN: [id number] Location: [facility] Date of  Service: 09/20/2017 Indications:Anatomy U/S Findings: Tiffany [name] intrauterine pregnancy is visualized with FHR at 141 BPM. Biometrics give an (U/S) Gestational age of [redacted]w[redacted]d and an (U/S) EDD of 02/16/2018; this correlates with the clinically established Estimated Date of Delivery: 02/19/18 Fetal presentation is Breech. EFW: 9 oz. Placenta: Anterior, grade 0. AFI: subjectively normal. Anatomic survey is complete and normal; Gender - female.  Right Ovary is normal in appearance. Left Ovary is normal appearance. Survey of the adnexa demonstrates no adnexal masses. There is no free peritoneal fluid in the cul de sac. Impression: 1. [redacted]w[redacted]d Viable Singleton Intrauterine pregnancy by U/S. 2. (U/S) EDD is consistent with Clinically established Estimated Date of Delivery: 02/19/18 . 3. Normal Anatomy Scan Recommendations: 1.Clinical correlation with the patient's History and Physical Exam. Tiffany [name], RDMS, RVT  There is a singleton gestation with subjectively normal amniotic fluid volume. The fetal biometry correlates with established dating. Detailed evaluation of the fetal anatomy was performed.The fetal anatomical survey appears within normal limits within the resolution of ultrasound as described above.  It must be noted that a normal ultrasound is unable to rule out fetal aneuploidy.  Tiffany Mood, MD, [facility], [facility] 09/20/2017, 5:10 PM     Assessment   37 y.o. [id number] at [redacted]w[redacted]d by  02/19/2018, by Ultrasound presenting for routine prenatal visit  Plan   Pregnancy#3 Problems (from 05/13/17 to present)    Problem Noted Resolved   History of 2 cesarean sections 08/27/2017 by Tiffany Mood, MD No   Pregnancy resulting from in vitro fertilization, antepartum 07/28/2017 by Rod Can, CNM No   Overview Signed 08/27/2017  3:40 PM by Tiffany Mood, MD    [ ]  Fetal Echo 22 weeks      Supervision of high risk pregnancy, antepartum 07/28/2017 by  Rod Can, CNM No    Overview Addendum 08/27/2017  3:42 PM by Tiffany Mood, MD    Clinic [facility] Prenatal Labs  Dating Embryo transfer date Blood type: O/Positive/-- (03/13 1624)   Genetic Screen NIPS: 07/28/17 Normal XX Antibody:Negative (03/13 1624)  Anatomic [place]  Rubella: 1.01 (03/13 1624) Varicella: Immune  GTT  RPR: Non Reactive (03/13 1624)   Rhogam N/A HBsAg: Negative (03/13 1624)   TDaP vaccine                       Flu Shot: received at work HIV: Non Reactive (03/13 1624)   Baby Food                                GBS:   Contraception  Pap: 01/29/2016 NILM HPV negative  CBB     CS/VBAC x2 desires repeat   Support Person [name]               Gestational age appropriate obstetric precautions including but not limited to vaginal bleeding, contractions, leaking of fluid and fetal movement were reviewed in detail with the patient.   - Discussed BTL at time of C-section, interested.  Still has 4 embryo's frozen - Fetal echo pending  Return in about 1 month (around 10/18/2017) for ROB.  Tiffany Mood, MD, [name], [place]

## 2017-09-24 ENCOUNTER — Other Ambulatory Visit: Payer: 59

## 2017-09-24 ENCOUNTER — Encounter: Payer: 59 | Admitting: Obstetrics and Gynecology

## 2017-10-18 DIAGNOSIS — Z3689 Encounter for other specified antenatal screening: Secondary | ICD-10-CM | POA: Diagnosis not present

## 2017-10-18 DIAGNOSIS — O09812 Supervision of pregnancy resulting from assisted reproductive technology, second trimester: Secondary | ICD-10-CM | POA: Diagnosis not present

## 2017-10-18 DIAGNOSIS — Z3A22 22 weeks gestation of pregnancy: Secondary | ICD-10-CM | POA: Diagnosis not present

## 2017-10-21 ENCOUNTER — Ambulatory Visit (INDEPENDENT_AMBULATORY_CARE_PROVIDER_SITE_OTHER): Payer: 59 | Admitting: Obstetrics and Gynecology

## 2017-10-21 DIAGNOSIS — O09819 Supervision of pregnancy resulting from assisted reproductive technology, unspecified trimester: Secondary | ICD-10-CM

## 2017-11-02 ENCOUNTER — Ambulatory Visit: Payer: Self-pay

## 2017-11-02 ENCOUNTER — Ambulatory Visit (INDEPENDENT_AMBULATORY_CARE_PROVIDER_SITE_OTHER): Payer: 59 | Admitting: General Surgery

## 2017-11-02 ENCOUNTER — Encounter: Payer: Self-pay | Admitting: General Surgery

## 2017-11-02 VITALS — BP 120/73 | HR 78 | Resp 14 | Ht 60.0 in | Wt 150.0 lb

## 2017-11-02 DIAGNOSIS — R2232 Localized swelling, mass and lump, left upper limb: Secondary | ICD-10-CM | POA: Diagnosis not present

## 2017-11-02 NOTE — Progress Notes (Signed)
Patient ID: Tiffany [name] Section, female   DOB: Aug 01, 1980, 37 y.o.   MRN: [id number]  Chief Complaint  Patient presents with  . Other    left axillary mass    HPI Tiffany [name] is a 37 y.o. female here for an evaluation of a lump under her left arm. She first noticed this about week ago. She reports that is sore to touch.  She had this occur once before shortly after delivering her second child about 2 years ago. The area subsequently resolved on its own.  No history of trauma to the area.  No symptoms of weight loss, fever, chills or night sweats.   She is currently [redacted] weeks pregnant.   HPI  Past Medical History:  Diagnosis Date  . GERD (gastroesophageal reflux disease)    during pregnancy  . PONV (postoperative nausea and vomiting)    nausea with laparoscopy    Past Surgical History:  Procedure Laterality Date  . CESAREAN SECTION  02/2014  . CESAREAN SECTION N/A 12/19/2015   Procedure: CESAREAN SECTION;  Surgeon: Malachy Mood, MD;  Location: [facility] ORS;  Service: Obstetrics;  Laterality: N/A;  Time of Birth 08:24 Sex: female Weight: 8 lb 2 oz  . diagnoastic lap  2013  . DILATION AND CURETTAGE OF UTERUS    . endometriosis     history of  . HERNIA REPAIR Right 1984  . INTRAUTERINE DEVICE (IUD) INSERTION  2015  . IUD REMOVAL  2016    No family history on file.  Social History Social History   Tobacco Use  . Smoking status: Never Smoker  . Smokeless tobacco: Never Used  Substance Use Topics  . Alcohol use: No  . Drug use: No    Allergies  Allergen Reactions  . Zantac [Ranitidine Hcl] Anaphylaxis  . Penicillins Rash    Has patient had a PCN reaction causing immediate rash, facial/tongue/throat swelling, SOB or lightheadedness with hypotension: Yes Has patient had a PCN reaction causing severe rash involving mucus membranes or skin necrosis: Yes Has patient had a PCN reaction that required hospitalization No Has patient had a PCN reaction occurring within the  last 10 years: No If all of the above answers are "NO", then may proceed with Cephalosporin use.   . Procardia [Nifedipine] Rash    Current Outpatient Medications  Medication Sig Dispense Refill  . Omeprazole Magnesium (PRILOSEC OTC PO) Take 1 capsule by mouth daily.    . Prenatal Vit-Fe Fumarate-FA (MULTIVITAMIN-PRENATAL) 27-0.8 MG TABS tablet Take 1 tablet by mouth daily at 12 noon.     No current facility-administered medications for this visit.     Review of Systems Review of Systems  Constitutional: Negative.   Respiratory: Negative.   Cardiovascular: Negative.     Blood pressure 120/73, pulse 78, resp. rate 14, height 5' (1.524 m), weight 150 lb (68 kg), last menstrual period 05/13/2017, unknown if currently breastfeeding.  Physical Exam Physical Exam  Constitutional: She is oriented to person, place, and time. She appears well-developed and well-nourished.  Eyes: Conjunctivae are normal. No scleral icterus.  Neck: Neck supple.  Cardiovascular: Normal rate, regular rhythm and normal heart sounds.  Pulmonary/Chest: Effort normal and breath sounds normal. Right breast exhibits no inverted nipple, no mass, no nipple discharge, no skin change and no tenderness. Left breast exhibits no inverted nipple, no mass, no nipple discharge, no skin change and no tenderness.    Lymphadenopathy:    She has no cervical adenopathy.    She has no  axillary adenopathy.  Neurological: She is alert and oriented to person, place, and time.  Skin: Skin is warm and dry.  Psychiatric: She has a normal mood and affect.    Data Reviewed Ultrasound examination of the axillary tail and left axilla was undertaken to determine if lymphadenopathy was present.  The axillary vein and artery are patent.  No axillary adenopathy is noted.  No cystic or solid lesions within the axillary tail are appreciated.  Normal exam.  BI-RADS-1.  Assessment    Prominence of the axillary tail without associated  pathologic process.    Plan    No intervention is required.  The patient was encouraged to call if the area becomes tender, erythematous or develops drainage.    HPI, Physical Exam, Assessment and Plan have been scribed under the direction and in the presence of Robert Bellow, MD  Concepcion Living, LPN  I have completed the exam and reviewed the above documentation for accuracy and completeness.  I agree with the above.  Haematologist has been used and any errors in dictation or transcription are unintentional.  Hervey Ard, M.D., F.A.C.S.  Forest Gleason Yizel Canby 11/02/2017, 7:51 PM

## 2017-11-02 NOTE — Patient Instructions (Signed)
Follow up as needed

## 2017-11-19 ENCOUNTER — Ambulatory Visit (INDEPENDENT_AMBULATORY_CARE_PROVIDER_SITE_OTHER): Payer: 59 | Admitting: Obstetrics and Gynecology

## 2017-11-19 VITALS — BP 100/60 | Wt 152.0 lb

## 2017-11-19 DIAGNOSIS — Z3A26 26 weeks gestation of pregnancy: Secondary | ICD-10-CM

## 2017-11-19 DIAGNOSIS — O09819 Supervision of pregnancy resulting from assisted reproductive technology, unspecified trimester: Secondary | ICD-10-CM

## 2017-11-19 DIAGNOSIS — O0990 Supervision of high risk pregnancy, unspecified, unspecified trimester: Secondary | ICD-10-CM

## 2017-11-19 DIAGNOSIS — Z98891 History of uterine scar from previous surgery: Secondary | ICD-10-CM

## 2017-11-19 NOTE — Progress Notes (Signed)
Routine Prenatal Care Visit  Subjective  Tiffany [name] is a 37 y.o. G3P2002 at [redacted]w[redacted]d being seen today for ongoing prenatal care.  She is currently monitored for the following issues for this high-risk pregnancy and has Pregnancy resulting from in vitro fertilization, antepartum; Supervision of high risk pregnancy, antepartum; History of 2 cesarean sections; and Axillary mass, left on their problem list.  ----------------------------------------------------------------------------------- Patient reports no complaints.   Contractions: Not present. Vag. Bleeding: None.  Movement: Present. Denies leaking of fluid.  ----------------------------------------------------------------------------------- The following portions of the patient's history were reviewed and updated as appropriate: allergies, current medications, past family history, past medical history, past social history, past surgical history and problem list. Problem list updated.   Objective  Blood pressure 100/60, weight 152 lb (68.9 kg), last menstrual period 05/13/2017, unknown if currently breastfeeding. Pregravid weight 130 lb (59 kg) Total Weight Gain 22 lb (9.979 kg) Urinalysis: Urine Protein: Negative Urine Glucose: Negative  Fetal Status: Fetal Heart Rate (bpm): 140   Movement: Present     General:  Alert, oriented and cooperative. Patient is in no acute distress.  Skin: Skin is warm and dry. No rash noted.   Cardiovascular: Normal heart rate noted  Respiratory: Normal respiratory effort, no problems with respiration noted  Abdomen: Soft, gravid, appropriate for gestational age. Pain/Pressure: Present     Pelvic:  Cervical exam deferred        Extremities: Normal range of motion.     ental Status: Normal mood and affect. Normal behavior. Normal judgment and thought content.     Assessment   37 y.o. [id number] at [redacted]w[redacted]d by  02/19/2018, by Ultrasound presenting for routine prenatal visit  Plan   Pregnancy#3  Problems (from 05/13/17 to present)    Problem Noted Resolved   History of 2 cesarean sections 08/27/2017 by Malachy Mood, MD No   Pregnancy resulting from in vitro fertilization, antepartum 07/28/2017 by Rod Can, CNM No   Overview Addendum 11/08/2017  4:35 AM by Malachy Mood, MD    [x]  Fetal Echo 22 weeks negative      Supervision of high risk pregnancy, antepartum 07/28/2017 by Rod Can, CNM No   Overview Addendum 09/23/2017  9:39 AM by Malachy Mood, MD    Clinic [facility] Prenatal Labs  Dating Embryo transfer date Blood type: O/Positive/-- (03/13 1624)   Genetic Screen NIPS: 07/28/17 Normal XX Antibody:Negative (03/13 1624)  Anatomic [place] Complete. Normal. Rubella: 1.01 (03/13 1624) Varicella: Immune  GTT  RPR: Non Reactive (03/13 1624)   Rhogam N/A HBsAg: Negative (03/13 1624)   TDaP vaccine                        Flu Shot: received at work HIV: Non Reactive (03/13 1624)   Baby Food  breast                              GBS:   Contraception BTL Pap: 01/29/2016 NILM HPV negative  CBB     CS/VBAC x2 desires repeat   Support Person [name]               Gestational age appropriate obstetric precautions including but not limited to vaginal bleeding, contractions, leaking of fluid and fetal movement were reviewed in detail with the patient.    28 week labs ordered for her next visit.  Return in about 1 week (around 11/26/2017) for ROB and GTT.  Adrian Prows MD [facility], [facility] 11/19/17 4:16 PM

## 2017-11-30 ENCOUNTER — Ambulatory Visit (INDEPENDENT_AMBULATORY_CARE_PROVIDER_SITE_OTHER): Payer: 59 | Admitting: Obstetrics and Gynecology

## 2017-11-30 ENCOUNTER — Other Ambulatory Visit: Payer: 59

## 2017-11-30 VITALS — BP 110/70 | Wt 155.0 lb

## 2017-11-30 DIAGNOSIS — O09819 Supervision of pregnancy resulting from assisted reproductive technology, unspecified trimester: Secondary | ICD-10-CM

## 2017-11-30 DIAGNOSIS — Z3A28 28 weeks gestation of pregnancy: Secondary | ICD-10-CM

## 2017-11-30 DIAGNOSIS — O0990 Supervision of high risk pregnancy, unspecified, unspecified trimester: Secondary | ICD-10-CM

## 2017-11-30 DIAGNOSIS — Z98891 History of uterine scar from previous surgery: Secondary | ICD-10-CM

## 2017-11-30 MED ORDER — HYDROCORTISONE 2.5 % RE CREA
1.0000 | TOPICAL_CREAM | Freq: Two times a day (BID) | RECTAL | 0 refills | Status: DC
Start: 2017-11-30 — End: 2017-12-30

## 2017-11-30 NOTE — Progress Notes (Signed)
ROB 28 week labs Hemorrhoid

## 2017-11-30 NOTE — Progress Notes (Signed)
Routine Prenatal Care Visit  Subjective  Tiffany [name] is a 37 y.o. G3P2002 at [redacted]w[redacted]d being seen today for ongoing prenatal care.  She is currently monitored for the following issues for this high-risk pregnancy and has Pregnancy resulting from in vitro fertilization, antepartum; Supervision of high risk pregnancy, antepartum; History of 2 cesarean sections; and Axillary mass, left on their problem list.  ----------------------------------------------------------------------------------- Patient reports no complaints.   Contractions: Not present. Vag. Bleeding: None.  Movement: Present. Denies leaking of fluid.  ----------------------------------------------------------------------------------- The following portions of the patient's history were reviewed and updated as appropriate: allergies, current medications, past family history, past medical history, past social history, past surgical history and problem list. Problem list updated.   Objective  Blood pressure 110/70, weight 155 lb (70.3 kg), last menstrual period 05/13/2017, unknown if currently breastfeeding. Pregravid weight 130 lb (59 kg) Total Weight Gain 25 lb (11.3 kg) Urinalysis:      Fetal Status: Fetal Heart Rate (bpm): 145 Fundal Height: 29 cm Movement: Present     General:  Alert, oriented and cooperative. Patient is in no acute distress.  Skin: Skin is warm and dry. No rash noted.   Cardiovascular: Normal heart rate noted  Respiratory: Normal respiratory effort, no problems with respiration noted  Abdomen: Soft, gravid, appropriate for gestational age. Pain/Pressure: Present     Pelvic:  Cervical exam deferred        Extremities: Normal range of motion.     ental Status: Normal mood and affect. Normal behavior. Normal judgment and thought content.     Assessment   37 y.o. [id number] at 101w3d by  02/19/2018, by Ultrasound presenting for routine prenatal visit  Plan   Pregnancy#3 Problems (from 05/13/17 to  present)    Problem Noted Resolved   History of 2 cesarean sections 08/27/2017 by Malachy Mood, MD No   Pregnancy resulting from in vitro fertilization, antepartum 07/28/2017 by Rod Can, CNM No   Overview Addendum 11/08/2017  4:35 AM by Malachy Mood, MD    [x]  Fetal Echo 22 weeks negative      Supervision of high risk pregnancy, antepartum 07/28/2017 by Rod Can, CNM No   Overview Addendum 11/19/2017  4:18 PM by Homero Fellers, [place] Prenatal Labs  Dating Embryo transfer date Blood type: O/Positive/-- (03/13 1624)   Genetic Screen NIPS: 07/28/17 Normal XX Antibody:Negative (03/13 1624)  Anatomic [place] Complete, normal Rubella: 1.01 (03/13 1624) Varicella: Immune  GTT  RPR: Non Reactive (03/13 1624)   Rhogam N/A HBsAg: Negative (03/13 1624)   TDaP vaccine                        Flu Shot: received at work HIV: Non Reactive (03/13 1624)   Baby Food  Breast                              GBS:   Contraception BTL Pap: 01/29/2016 NILM HPV negative  CBB     CS/VBAC x2 desires repeat   Support Person Husband Tiffany [name]               Gestational age appropriate obstetric precautions including but not limited to vaginal bleeding, contractions, leaking of fluid and fetal movement were reviewed in detail with the patient.   - Patient does feel like this baby is significantly bigger than her prior two pregnancies.  Experiencing increased discomfort of  pregnancy.  We discussed growth scan at 36 weeks.  Limiting work in the third trimester if symptoms worsen. - 28 week labs today  Return in about 2 weeks (around 12/14/2017) for ROB.  Malachy Mood, MD, [facility], [facility] 11/30/2017, 12:02 PM

## 2017-12-01 LAB — 28 WEEK RH+PANEL
BASOS ABS: 0 10*3/uL (ref 0.0–0.2)
Basos: 0 %
EOS (ABSOLUTE): 0.1 10*3/uL (ref 0.0–0.4)
Eos: 1 %
Gestational Diabetes Screen: 98 mg/dL (ref 65–139)
HIV SCREEN 4TH GENERATION: NONREACTIVE
Hematocrit: 36.8 % (ref 34.0–46.6)
Hemoglobin: 12.4 g/dL (ref 11.1–15.9)
IMMATURE GRANS (ABS): 0.1 10*3/uL (ref 0.0–0.1)
Immature Granulocytes: 1 %
LYMPHS: 14 %
Lymphocytes Absolute: 1.4 10*3/uL (ref 0.7–3.1)
MCH: 31.3 pg (ref 26.6–33.0)
MCHC: 33.7 g/dL (ref 31.5–35.7)
MCV: 93 fL (ref 79–97)
Monocytes Absolute: 0.5 10*3/uL (ref 0.1–0.9)
Monocytes: 5 %
Neutrophils Absolute: 7.7 10*3/uL — ABNORMAL HIGH (ref 1.4–7.0)
Neutrophils: 79 %
PLATELETS: 188 10*3/uL (ref 150–450)
RBC: 3.96 x10E6/uL (ref 3.77–5.28)
RDW: 13.2 % (ref 12.3–15.4)
RPR Ser Ql: NONREACTIVE
WBC: 9.8 10*3/uL (ref 3.4–10.8)

## 2017-12-02 NOTE — Progress Notes (Signed)
Negative, Released to mychart 

## 2017-12-16 ENCOUNTER — Ambulatory Visit (INDEPENDENT_AMBULATORY_CARE_PROVIDER_SITE_OTHER): Payer: 59 | Admitting: Obstetrics and Gynecology

## 2017-12-16 VITALS — BP 110/80 | Wt 157.0 lb

## 2017-12-16 DIAGNOSIS — Z3A30 30 weeks gestation of pregnancy: Secondary | ICD-10-CM

## 2017-12-16 DIAGNOSIS — O09813 Supervision of pregnancy resulting from assisted reproductive technology, third trimester: Secondary | ICD-10-CM

## 2017-12-16 DIAGNOSIS — O0990 Supervision of high risk pregnancy, unspecified, unspecified trimester: Secondary | ICD-10-CM

## 2017-12-16 DIAGNOSIS — Z23 Encounter for immunization: Secondary | ICD-10-CM | POA: Diagnosis not present

## 2017-12-16 DIAGNOSIS — Z98891 History of uterine scar from previous surgery: Secondary | ICD-10-CM

## 2017-12-16 DIAGNOSIS — O09819 Supervision of pregnancy resulting from assisted reproductive technology, unspecified trimester: Secondary | ICD-10-CM

## 2017-12-16 DIAGNOSIS — O34219 Maternal care for unspecified type scar from previous cesarean delivery: Secondary | ICD-10-CM

## 2017-12-16 NOTE — Progress Notes (Signed)
Routine Prenatal Care Visit  Subjective  Tiffany [name] is a 37 y.o. G3P2002 at [redacted]w[redacted]d being seen today for ongoing prenatal care.  She is currently monitored for the following issues for this high-risk pregnancy and has Pregnancy resulting from in vitro fertilization, antepartum; Supervision of high risk pregnancy, antepartum; History of 2 cesarean sections; and Axillary mass, left on their problem list.  ----------------------------------------------------------------------------------- Patient reports worsening left sided round ligament pain and lumbago  Contractions: Not present. Vag. Bleeding: None.  Movement: Present. Denies leaking of fluid.  ----------------------------------------------------------------------------------- The following portions of the patient's history were reviewed and updated as appropriate: allergies, current medications, past family history, past medical history, past social history, past surgical history and problem list. Problem list updated.   Objective  Blood pressure 110/80, weight 157 lb (71.2 kg), last menstrual period 05/13/2017, unknown if currently breastfeeding. Pregravid weight 130 lb (59 kg) Total Weight Gain 27 lb (12.2 kg) Urinalysis: Urine Protein: Negative Urine Glucose: Negative  Fetal Status: Fetal Heart Rate (bpm): 140 Fundal Height: 31 cm Movement: Present     General:  Alert, oriented and cooperative. Patient is in no acute distress.  Skin: Skin is warm and dry. No rash noted.   Cardiovascular: Normal heart rate noted  Respiratory: Normal respiratory effort, no problems with respiration noted  Abdomen: Soft, gravid, appropriate for gestational age. Pain/Pressure: Present     Pelvic:  Cervical exam deferred        Extremities: Normal range of motion.     ental Status: Normal mood and affect. Normal behavior. Normal judgment and thought content.     Assessment   37 y.o. [id number] at [redacted]w[redacted]d by  02/19/2018, by Ultrasound presenting  for routine prenatal visit  Plan   Pregnancy#3 Problems (from 05/13/17 to present)    Problem Noted Resolved   History of 2 cesarean sections 08/27/2017 by Malachy Mood, MD No   Pregnancy resulting from in vitro fertilization, antepartum 07/28/2017 by Rod Can, CNM No   Overview Addendum 11/08/2017  4:35 AM by Malachy Mood, MD    [x]  Fetal Echo 22 weeks negative      Supervision of high risk pregnancy, antepartum 07/28/2017 by Rod Can, CNM No   Overview Addendum 11/19/2017  4:18 PM by Homero Fellers, [place] Prenatal Labs  Dating Embryo transfer date Blood type: O/Positive/-- (03/13 1624)   Genetic Screen NIPS: 07/28/17 Normal XX Antibody:Negative (03/13 1624)  Anatomic [place] Complete, normal Rubella: 1.01 (03/13 1624) Varicella: Immune  GTT 98 RPR: Non Reactive (03/13 1624)   Rhogam N/A HBsAg: Negative (03/13 1624)   TDaP vaccine 12/16/17                      Flu Shot: received at work HIV: Non Reactive (03/13 1624)   Baby Food Breast                              GBS:   Contraception BTL Pap: 01/29/2016 NILM HPV negative  CBB     CS/VBAC x2 desires repeat   Support Person [name]               Gestational age appropriate obstetric precautions including but not limited to vaginal bleeding, contractions, leaking of fluid and fetal movement were reviewed in detail with the patient.   - TDAP administration today - discussed supportive option for round ligament pain and lumbago  Return in about 2 weeks (around 12/30/2017) for ROB.  Malachy Mood, MD, [facility], [facility] 12/16/2017, 4:14 PM

## 2017-12-16 NOTE — Progress Notes (Signed)
ROB TDAP/Blood form

## 2017-12-30 ENCOUNTER — Encounter: Payer: Self-pay | Admitting: Obstetrics and Gynecology

## 2017-12-30 ENCOUNTER — Telehealth: Payer: Self-pay

## 2017-12-30 ENCOUNTER — Ambulatory Visit (INDEPENDENT_AMBULATORY_CARE_PROVIDER_SITE_OTHER): Payer: 59 | Admitting: Obstetrics and Gynecology

## 2017-12-30 VITALS — BP 120/64 | Wt 158.0 lb

## 2017-12-30 DIAGNOSIS — O26843 Uterine size-date discrepancy, third trimester: Secondary | ICD-10-CM

## 2017-12-30 DIAGNOSIS — Z98891 History of uterine scar from previous surgery: Secondary | ICD-10-CM

## 2017-12-30 DIAGNOSIS — O09819 Supervision of pregnancy resulting from assisted reproductive technology, unspecified trimester: Secondary | ICD-10-CM

## 2017-12-30 DIAGNOSIS — O09813 Supervision of pregnancy resulting from assisted reproductive technology, third trimester: Secondary | ICD-10-CM

## 2017-12-30 DIAGNOSIS — O34219 Maternal care for unspecified type scar from previous cesarean delivery: Secondary | ICD-10-CM

## 2017-12-30 DIAGNOSIS — Z3A32 32 weeks gestation of pregnancy: Secondary | ICD-10-CM

## 2017-12-30 DIAGNOSIS — O0990 Supervision of high risk pregnancy, unspecified, unspecified trimester: Secondary | ICD-10-CM

## 2017-12-30 LAB — POCT URINALYSIS DIPSTICK OB
Glucose, UA: NEGATIVE — AB
POC,PROTEIN,UA: NEGATIVE

## 2017-12-30 NOTE — Progress Notes (Signed)
ROB Allergies

## 2017-12-30 NOTE — Telephone Encounter (Signed)
FMLA/DISABILITY form for [employer] filled out, signature obtained, and given to [place] for processing.

## 2017-12-30 NOTE — Progress Notes (Signed)
Routine Prenatal Care Visit  Subjective  Tiffany [name] is a 37 y.o. G3P2002 at [redacted]w[redacted]d being seen today for ongoing prenatal care.  She is currently monitored for the following issues for this high-risk pregnancy and has Pregnancy resulting from in vitro fertilization, antepartum; Supervision of high risk pregnancy, antepartum; History of 2 cesarean sections; and Axillary mass, left on their problem list.  ----------------------------------------------------------------------------------- Patient reports no complaints.   Contractions: Irregular. Vag. Bleeding: None.  Movement: Present. Denies leaking of fluid.  ----------------------------------------------------------------------------------- The following portions of the patient's history were reviewed and updated as appropriate: allergies, current medications, past family history, past medical history, past social history, past surgical history and problem list. Problem list updated.   Objective  Blood pressure 120/64, weight 158 lb (71.7 kg), last menstrual period 05/13/2017, unknown if currently breastfeeding. Pregravid weight 130 lb (59 kg) Total Weight Gain 28 lb (12.7 kg) Urinalysis:      Fetal Status: Fetal Heart Rate (bpm): 140 Fundal Height: 35 cm Movement: Present     General:  Alert, oriented and cooperative. Patient is in no acute distress.  Skin: Skin is warm and dry. No rash noted.   Cardiovascular: Normal heart rate noted  Respiratory: Normal respiratory effort, no problems with respiration noted  Abdomen: Soft, gravid, appropriate for gestational age. Pain/Pressure: Absent     Pelvic:  Cervical exam deferred        Extremities: Normal range of motion.     ental Status: Normal mood and affect. Normal behavior. Normal judgment and thought content.     Assessment   37 y.o. [id number] at [redacted]w[redacted]d by  02/19/2018, by Ultrasound presenting for routine prenatal visit  Plan   Pregnancy#3 Problems (from 05/13/17 to  present)    Problem Noted Resolved   History of 2 cesarean sections 08/27/2017 by Malachy Mood, MD No   Pregnancy resulting from in vitro fertilization, antepartum 07/28/2017 by Rod Can, CNM No   Overview Addendum 11/08/2017  4:35 AM by Malachy Mood, MD    [x]  Fetal Echo 22 weeks negative      Supervision of high risk pregnancy, antepartum 07/28/2017 by Rod Can, CNM No   Overview Addendum 12/16/2017  4:00 PM by Malachy Mood, MD    Clinic [facility] Prenatal Labs  Dating Embryo transfer date Blood type: O/Positive/-- (03/13 1624)   Genetic Screen NIPS: 07/28/17 Normal XX Antibody:Negative (03/13 1624)  Anatomic [place] Complete, normal Rubella: 1.01 (03/13 1624) Varicella: Immune  GTT 98 RPR: Non Reactive (03/13 1624)   Rhogam N/A HBsAg: Negative (03/13 1624)   TDaP vaccine 12/16/17                    Flu Shot: received at work HIV: Non Reactive (03/13 1624)   Baby Food Breast                              GBS:   Contraception BTL Pap: 01/29/2016 NILM HPV negative  CBB     CS/VBAC x2 desires repeat   Support Person [name]               Gestational age appropriate obstetric precautions including but not limited to vaginal bleeding, contractions, leaking of fluid and fetal movement were reviewed in detail with the patient.    Cesarean section scheduled for 02/15/18 with [facility].  Growth [place] at next visit because of increased fundal height. Questionable for polyhydramnios.   Return in  about 2 weeks (around 01/13/2018) for ROB.  Adrian Prows MD [facility], [facility] 12/30/17 5:01 PM

## 2017-12-31 ENCOUNTER — Telehealth: Payer: Self-pay | Admitting: Obstetrics and Gynecology

## 2017-12-31 NOTE — Telephone Encounter (Signed)
Patient is aware of AFI [place] on 01/13/18 @ 3:30pm (added per Dr. Gilman Schmidt), H&P at [facility] on 02/11/18 @ 2:10pm w/ Dr. Georgianne Fick, Pre-admit Testing to be scheduled for 02/14/18, and OR on 02/15/18.

## 2017-12-31 NOTE — Telephone Encounter (Signed)
Lmtrc

## 2017-12-31 NOTE — Telephone Encounter (Signed)
-----   Message from Homero Fellers, MD sent at 12/30/2017  4:58 PM EDT ----- Surgery Booking Request Patient Full Name:  Tiffany [name]  MRN: [id number]  DOB: October 28, 1980  Surgeon: Homero Fellers, MD  Requested Surgery Date and Time: February 15, 2018 Primary Diagnosis AND Code: Hx of previous cesarean deliveries Secondary Diagnosis and Code:  Surgical Procedure: Cesarean section with tubal ligation L&D Notification: Yes Admission Status: surgery admit Length of Surgery: 1 hour Special Case Needs: none H&P: yes (date) Phone Interview???: yes Interpreter: Language:  Medical Clearance: no Special Scheduling Instructions: Schedule for [facility]  ## NANCY: can you please also call this patient and add on a [place] for her next appointment? Thank you

## 2018-01-05 ENCOUNTER — Telehealth: Payer: Self-pay

## 2018-01-05 NOTE — Telephone Encounter (Signed)
FMLA/DISABILITY form (additional) for [employer] filled out, signature obtained and given to [place] for processing.

## 2018-01-13 ENCOUNTER — Ambulatory Visit (INDEPENDENT_AMBULATORY_CARE_PROVIDER_SITE_OTHER): Payer: 59

## 2018-01-13 ENCOUNTER — Encounter: Payer: 59 | Admitting: Obstetrics and Gynecology

## 2018-01-13 ENCOUNTER — Ambulatory Visit (INDEPENDENT_AMBULATORY_CARE_PROVIDER_SITE_OTHER): Payer: 59 | Admitting: Obstetrics and Gynecology

## 2018-01-13 ENCOUNTER — Other Ambulatory Visit: Payer: Self-pay | Admitting: Obstetrics and Gynecology

## 2018-01-13 VITALS — BP 104/60 | Wt 158.0 lb

## 2018-01-13 DIAGNOSIS — O26843 Uterine size-date discrepancy, third trimester: Secondary | ICD-10-CM

## 2018-01-13 DIAGNOSIS — Z3A34 34 weeks gestation of pregnancy: Secondary | ICD-10-CM

## 2018-01-13 DIAGNOSIS — O0990 Supervision of high risk pregnancy, unspecified, unspecified trimester: Secondary | ICD-10-CM

## 2018-01-13 DIAGNOSIS — O09813 Supervision of pregnancy resulting from assisted reproductive technology, third trimester: Secondary | ICD-10-CM

## 2018-01-13 DIAGNOSIS — Z98891 History of uterine scar from previous surgery: Secondary | ICD-10-CM

## 2018-01-13 DIAGNOSIS — O403XX0 Polyhydramnios, third trimester, not applicable or unspecified: Secondary | ICD-10-CM

## 2018-01-13 DIAGNOSIS — O09819 Supervision of pregnancy resulting from assisted reproductive technology, unspecified trimester: Secondary | ICD-10-CM

## 2018-01-13 DIAGNOSIS — O34219 Maternal care for unspecified type scar from previous cesarean delivery: Secondary | ICD-10-CM

## 2018-01-13 LAB — POCT URINALYSIS DIPSTICK OB
Glucose, UA: NEGATIVE
POC,PROTEIN,UA: NEGATIVE

## 2018-01-13 NOTE — Progress Notes (Signed)
ROB

## 2018-01-15 NOTE — Progress Notes (Signed)
Routine Prenatal Care Visit  Subjective  Tiffany [name] is a 37 y.o. G3P2002 at [redacted]w[redacted]d being seen today for ongoing prenatal care.  She is currently monitored for the following issues for this low-risk pregnancy and has Pregnancy resulting from in vitro fertilization, antepartum; Supervision of high risk pregnancy, antepartum; History of 2 cesarean sections; and Axillary mass, left on their problem list.  ----------------------------------------------------------------------------------- Patient reports no complaints.   Contractions: Irregular. Vag. Bleeding: None.  Movement: Present. Denies leaking of fluid.  ----------------------------------------------------------------------------------- The following portions of the patient's history were reviewed and updated as appropriate: allergies, current medications, past family history, past medical history, past social history, past surgical history and problem list. Problem list updated.   Objective  Blood pressure 104/60, weight 158 lb (71.7 kg), last menstrual period 05/13/2017, unknown if currently breastfeeding. Pregravid weight 130 lb (59 kg) Total Weight Gain 28 lb (12.7 kg) Urinalysis:      Fetal Status: Fetal Heart Rate (bpm): 140 Fundal Height: 35 cm Movement: Present     General:  Alert, oriented and cooperative. Patient is in no acute distress.  Skin: Skin is warm and dry. No rash noted.   Cardiovascular: Normal heart rate noted  Respiratory: Normal respiratory effort, no problems with respiration noted  Abdomen: Soft, gravid, appropriate for gestational age. Pain/Pressure: Present     Pelvic:  Cervical exam deferred        Extremities: Normal range of motion.     ental Status: Normal mood and affect. Normal behavior. Normal judgment and thought content.   US Ob Follow Up  Result Date: 01/14/2018 ULTRASOUND REPORT Patient Name: Tiffany [name] DOB: [birth year] MRN: [id number] Location: [facility] Date of Service:  01/13/2018 Indications:fundal height does not correlate to gestational age, S>D Findings: Singleton intrauterine pregnancy is visualized with FHR at 124 bpm. Biometrics give an (U/S) Gestational age of [redacted]w[redacted]d and an (U/S) EDD of 02/02/2018; this correlates with the clinically established Estimated Date of Delivery: 02/19/18. Fetal presentation is Cephalic. Placenta: Anterior, grade 1. AFI: 24.72 cm BORDERLINE POLYHYDRAMNIOS Growth percentile is 81st with BPD, HC, AC and FL>90th. EFW: 6lbs 12oz/ 3057g Impression: 1. [redacted]w[redacted]d Viable Singleton Intrauterine pregnancy previously established criteria. 2. Growth is 81st with BPD, HC, AC and FL>90th. %ile.  AFI is 24.72 cm. Recommendations: 1.Clinical correlation with the patient's History and Physical Exam. Edwena Bunde, RDMS, RVT There is a singleton gestation with upper limits of normal amniotic fluid volume. The fetal biometry correlates with established dating.  Limited fetal anatomy was performed.The visualized fetal anatomical survey appears within normal limits within the resolution of ultrasound as described above.  It must be noted that a normal ultrasound is unable to rule out fetal aneuploidy.  Malachy Mood, MD, [facility], [facility]     Assessment   37 y.o. [id number] at [redacted]w[redacted]d by  02/19/2018, by Ultrasound presenting for routine prenatal visit  Plan   Pregnancy#3 Problems (from 05/13/17 to present)    Problem Noted Resolved   History of 2 cesarean sections 08/27/2017 by Malachy Mood, MD No   Pregnancy resulting from in vitro fertilization, antepartum 07/28/2017 by Rod Can, CNM No   Overview Addendum 11/08/2017  4:35 AM by Malachy Mood, MD    [x]  Fetal Echo 22 weeks negative      Supervision of high risk pregnancy, antepartum 07/28/2017 by Rod Can, CNM No   Overview Addendum 12/16/2017  4:00 PM by Malachy Mood, MD    Clinic [facility] Prenatal Labs  Dating Embryo transfer date Blood type:  O/Positive/-- (03/13 1624)   Genetic Screen NIPS: 07/28/17 Normal XX Antibody:Negative (03/13 1624)  Anatomic [place] Complete, normal Rubella: 1.01 (03/13 1624) Varicella: Immune  GTT 98 RPR: Non Reactive (03/13 1624)   Rhogam N/A HBsAg: Negative (03/13 1624)   TDaP vaccine 12/16/17                    Flu Shot: received at work HIV: Non Reactive (03/13 1624)   Baby Food Breast                              GBS:   Contraception BTL Pap: 01/29/2016 NILM HPV negative  CBB     CS/VBAC x2 desires repeat   Support Person [name]               Gestational age appropriate obstetric precautions including but not limited to vaginal bleeding, contractions, leaking of fluid and fetal movement were reviewed in detail with the patient.    Return in about 1 week (around 01/20/2018) for ROB/AFI.  Malachy Mood, MD, [facility], [place]

## 2018-01-20 ENCOUNTER — Other Ambulatory Visit: Payer: 59

## 2018-01-20 ENCOUNTER — Ambulatory Visit (INDEPENDENT_AMBULATORY_CARE_PROVIDER_SITE_OTHER): Payer: 59

## 2018-01-20 DIAGNOSIS — O0990 Supervision of high risk pregnancy, unspecified, unspecified trimester: Secondary | ICD-10-CM

## 2018-01-20 DIAGNOSIS — O09813 Supervision of pregnancy resulting from assisted reproductive technology, third trimester: Secondary | ICD-10-CM | POA: Diagnosis not present

## 2018-01-20 DIAGNOSIS — O09819 Supervision of pregnancy resulting from assisted reproductive technology, unspecified trimester: Secondary | ICD-10-CM

## 2018-01-20 DIAGNOSIS — Z3A35 35 weeks gestation of pregnancy: Secondary | ICD-10-CM

## 2018-01-20 DIAGNOSIS — O403XX0 Polyhydramnios, third trimester, not applicable or unspecified: Secondary | ICD-10-CM

## 2018-01-21 ENCOUNTER — Encounter: Payer: Self-pay | Admitting: Obstetrics and Gynecology

## 2018-01-21 ENCOUNTER — Ambulatory Visit (INDEPENDENT_AMBULATORY_CARE_PROVIDER_SITE_OTHER): Payer: 59 | Admitting: Obstetrics and Gynecology

## 2018-01-21 VITALS — BP 108/60 | Wt 160.0 lb

## 2018-01-21 DIAGNOSIS — O0990 Supervision of high risk pregnancy, unspecified, unspecified trimester: Secondary | ICD-10-CM

## 2018-01-21 DIAGNOSIS — Z3A35 35 weeks gestation of pregnancy: Secondary | ICD-10-CM

## 2018-01-21 DIAGNOSIS — O403XX0 Polyhydramnios, third trimester, not applicable or unspecified: Secondary | ICD-10-CM

## 2018-01-21 DIAGNOSIS — O409XX0 Polyhydramnios, unspecified trimester, not applicable or unspecified: Secondary | ICD-10-CM | POA: Insufficient documentation

## 2018-01-21 DIAGNOSIS — Z98891 History of uterine scar from previous surgery: Secondary | ICD-10-CM

## 2018-01-21 DIAGNOSIS — O09819 Supervision of pregnancy resulting from assisted reproductive technology, unspecified trimester: Secondary | ICD-10-CM

## 2018-01-21 LAB — POCT URINALYSIS DIPSTICK OB
Glucose, UA: NEGATIVE
POC,PROTEIN,UA: NEGATIVE

## 2018-01-21 NOTE — Progress Notes (Signed)
Routine Prenatal Care Visit  Subjective  Tiffany [name] is a 37 y.o. G3P2002 at [redacted]w[redacted]d being seen today for ongoing prenatal care.  She is currently monitored for the following issues for this high-risk pregnancy and has Pregnancy resulting from in vitro fertilization, antepartum; Supervision of high risk pregnancy, antepartum; History of 2 cesarean sections; Axillary mass, left; and Polyhydramnios on their problem list.  ----------------------------------------------------------------------------------- Patient reports pressure symptoms from a distended abdomen with polyhydramnios.  Otherwise, regular issues with pregnancy.  Contractions: Irregular. Vag. Bleeding: None.  Movement: Present. Denies leaking of fluid.  ----------------------------------------------------------------------------------- The following portions of the patient's history were reviewed and updated as appropriate: allergies, current medications, past family history, past medical history, past social history, past surgical history and problem list. Problem list updated.  Objective  Blood pressure 108/60, weight 160 lb (72.6 kg), last menstrual period 05/13/2017, unknown if currently breastfeeding. Pregravid weight 130 lb (59 kg) Total Weight Gain 30 lb (13.6 kg) Urinalysis: Urine Protein Negative  Urine Glucose Negative  Fetal Status: Fetal Heart Rate (bpm): 140   Movement: Present     General:  Alert, oriented and cooperative. Patient is in no acute distress.  Skin: Skin is warm and dry. No rash noted.   Cardiovascular: Normal heart rate noted  Respiratory: Normal respiratory effort, no problems with respiration noted  Abdomen: Soft, gravid, appropriate for gestational age. Pain/Pressure: Present     Pelvic:  Cervical exam deferred        Extremities: Normal range of motion.     Mental Status: Normal [name] and affect. Normal behavior. Normal judgment and thought content.   US Ob Limited  Result Date:  01/20/2018 ULTRASOUND REPORT Patient Name: Tiffany [name] DOB: 24-Jul-1980 MRN: [id number] Location: [facility] Date of Service: 01/20/2018 Indications:AFI Findings: Tiffany [name] intrauterine pregnancy is visualized with FHR at 141 BPM. Fetal presentation is Breech. Placenta: anterior. Grade: 1 AFI: 34.03 cm Impression: 1. [redacted]w[redacted]d Viable Singleton Intrauterine pregnancy dated by previously established criteria. 2. AFI is 34.03 cm. Recommendations: 1.Clinical correlation with the patient's History and Physical Exam. Tiffany [name], RDMS, RVT There is a singleton increased amniotic fluid volume consistent with polyhyramnios.  The AFI has increased from 24cm to 34cm in the past week.   Limited fetal anatomy was performed.The visualized fetal anatomical survey appears within normal limits within the resolution of ultrasound as described above.  In particularly fetal stomach remains fully visualized as well as fetal bladder.  It must be noted that a normal ultrasound is unable to rule out fetal aneuploidy.  Polyhydramnios is the increase in amniotic fluid volume around the fetus describes as single deepest vertical pocket (SDP) of 8cm or amniotic fluid index (AFI) of >24.0cm, and affects approximately 1-2% of singleton pregnancies.  Previous research studies have used an amniotic fluid index cut of of >25cm, however 24.0cm represents the 97.5%ile at all gestational ages.  Polyhydramnios is further subclassified as mild 24.0-29.9cm or SDP 8-11cm comprising 65-70% of cases, moderate AFI 30.0-34.9cm or SDP 12-15cm comprising 20% of cases, and severe AFI >35cm or SDP >16cm comprising <15% of cases.  The majority of cases (60-70%) Tiffany be idiopathic, with the 2 most common pathologic causes attributable to maternal diabetes or fetal anomalies.  Idiopathic polyhdramnios is associated with an increased risk of macrosomia, with birth weight >4000g in 15-30% of cases as opposed to 8% in the general population.  The constellation  of polyhydramnios and intrauterine growth restriction raises concern for trisomy 13 or 18.  Amnioreduction should be limited  to patient with severe maternal discomfort, dyspnea, or both.  The use of indomethacin purely for the reduction of amniotic fluid volume is not recommended.  Isolated mild idiopathic polyhydramnios does not require additional antepartum surveillance, and delivery is not recommended prior to 39 weeks.  Cases of severe polyhydramnios should consider delivery at a tertiary care center given the high risk of associated fetal anatomic anomalies (20-40%).  [facility] Consult Series #46: Evaluation and management of polyhydramnios October 2018any additional antepartum fetal surveillance Tiffany Mood, MD, [place], [facility] 01/20/2018, 7:00 PM     Assessment   37 y.o. Tiffany [name] at [redacted]w[redacted]d by  02/19/2018, by Ultrasound presenting for routine prenatal visit  Plan   Pregnancy#3 Problems (from 05/13/17 to present)    Problem Noted Resolved   Polyhydramnios 01/21/2018 by Tiffany Bonnet, MD No   History of 2 cesarean sections 08/27/2017 by Tiffany Mood, MD No   Pregnancy resulting from in vitro fertilization, antepartum 07/28/2017 by Tiffany [name], CNM No   Overview Addendum 11/08/2017  4:35 AM by Tiffany Mood, MD    [x]  Fetal Echo 22 weeks negative      Supervision of high risk pregnancy, antepartum 07/28/2017 by Tiffany [name], CNM No   Overview Addendum 12/16/2017  4:00 PM by Tiffany Mood, MD    Clinic [facility] Prenatal Labs  Dating Embryo transfer date Blood type: O/Positive/-- (03/13 1624)   Genetic Screen NIPS: 07/28/17 Normal XX Antibody:Negative (03/13 1624)  Anatomic [place] Complete, normal Rubella: 1.01 (03/13 1624) Varicella: Immune  GTT 98 RPR: Non Reactive (03/13 1624)   Rhogam N/A HBsAg: Negative (03/13 1624)   TDaP vaccine 12/16/17                    Flu Shot: received at work HIV: Non Reactive (03/13 1624)   Baby Food Breast                               GBS:   Contraception BTL Pap: 01/29/2016 NILM HPV negative  CBB     CS/VBAC x2 desires repeat   Support Person Husband Tiffany [name]              Preterm labor symptoms and general obstetric precautions including but not limited to vaginal bleeding, contractions, leaking of fluid and fetal movement were reviewed in detail with the patient. Please refer to After Visit Summary for other counseling recommendations.   - she has already had a discussion with Dr. Georgianne Fick about her ultrasound findings from yesterday. He has submitted an MFM consult for scan of anatomy and fluid.  This is all pending. Just so she has follow up, I ordered another AFI for one week and routine prenatal. She should get a GBS test at that time.  She states that she did not have time for a pelvic exam today.   Return in about 6 days (around 01/27/2018) for schedule u/s for afi and routine prenatal.  Prentice Docker, MD, [place], [facility] 01/21/2018 5:55 PM

## 2018-01-24 ENCOUNTER — Telehealth: Payer: Self-pay | Admitting: Obstetrics and Gynecology

## 2018-01-24 NOTE — Telephone Encounter (Signed)
Patient is wanting to know about if she will be going to [facility] for Ultrasound. Please advise

## 2018-01-24 NOTE — Telephone Encounter (Signed)
Pt states AMS told her someone would be calling her to schedule an apt for an U/S with [facility]. [phone]

## 2018-01-25 ENCOUNTER — Other Ambulatory Visit: Payer: Self-pay | Admitting: Obstetrics and Gynecology

## 2018-01-25 DIAGNOSIS — O409XX0 Polyhydramnios, unspecified trimester, not applicable or unspecified: Secondary | ICD-10-CM

## 2018-01-25 NOTE — Telephone Encounter (Signed)
I sent the order I though I sent it Thursday night

## 2018-01-26 NOTE — Telephone Encounter (Signed)
Pt calling, is stressed b/c of running out of time.  AMS wants her to have u/s c [facility] this week as she is scheduled for c/s next Tues.  She is very uncomfortable.  Has u/s sched here Fri but it's not the right kind of u/s.  Please, please please call her and let her know what is happening.  [phone]

## 2018-01-27 ENCOUNTER — Telehealth: Payer: Self-pay | Admitting: Maternal Newborn

## 2018-01-27 ENCOUNTER — Ambulatory Visit
Admission: RE | Admit: 2018-01-27 | Discharge: 2018-01-27 | Disposition: A | Discharge status: Home / Self Care | Payer: 59 | Source: Ambulatory Visit | Attending: Obstetrics & Gynecology | Admitting: Obstetrics & Gynecology

## 2018-01-27 DIAGNOSIS — O409XX0 Polyhydramnios, unspecified trimester, not applicable or unspecified: Secondary | ICD-10-CM | POA: Diagnosis present

## 2018-01-27 DIAGNOSIS — Z3A37 37 weeks gestation of pregnancy: Secondary | ICD-10-CM | POA: Diagnosis not present

## 2018-01-27 DIAGNOSIS — O403XX0 Polyhydramnios, third trimester, not applicable or unspecified: Secondary | ICD-10-CM | POA: Insufficient documentation

## 2018-01-27 NOTE — Telephone Encounter (Signed)
Call wasn't from me.  Were we able to get her into [facility] today.  If not is it quicker for her to be seen at [facility] prior to next thursday

## 2018-01-27 NOTE — Telephone Encounter (Signed)
Patient is returning missed call. Please advise 

## 2018-01-28 ENCOUNTER — Encounter: Payer: 59 | Admitting: Obstetrics and Gynecology

## 2018-01-28 ENCOUNTER — Other Ambulatory Visit: Payer: 59

## 2018-01-28 ENCOUNTER — Ambulatory Visit (INDEPENDENT_AMBULATORY_CARE_PROVIDER_SITE_OTHER): Payer: 59 | Admitting: Maternal Newborn

## 2018-01-28 VITALS — BP 104/60 | Wt 159.0 lb

## 2018-01-28 DIAGNOSIS — O0990 Supervision of high risk pregnancy, unspecified, unspecified trimester: Secondary | ICD-10-CM

## 2018-01-28 DIAGNOSIS — Z3A36 36 weeks gestation of pregnancy: Secondary | ICD-10-CM

## 2018-01-28 LAB — POCT URINALYSIS DIPSTICK OB
Glucose, UA: NEGATIVE
PROTEIN: NEGATIVE

## 2018-01-28 NOTE — Progress Notes (Signed)
    Routine Prenatal Care Visit  Subjective  Tiffany [name] is a 37 y.o. G3P2002 at [redacted]w[redacted]d being seen today for ongoing prenatal care.  She is currently monitored for the following issues for this high-risk pregnancy and has Pregnancy resulting from in vitro fertilization, antepartum; Supervision of high risk pregnancy, antepartum; History of 2 cesarean sections; Axillary mass, left; and Polyhydramnios on their problem list.  ----------------------------------------------------------------------------------- Patient reports discomfort with polyhydramnios. Contractions: Irregular. Vag. Bleeding: None.  Movement: Present. No leaking of fluid.  ----------------------------------------------------------------------------------- The following portions of the patient's history were reviewed and updated as appropriate: allergies, current medications, past family history, past medical history, past social history, past surgical history and problem list. Problem list updated.   Objective  Blood pressure 104/60, weight 159 lb (72.1 kg), last menstrual period 05/13/2017. Pregravid weight 130 lb (59 kg) Total Weight Gain 29 lb (13.2 kg) Body mass index is 31.05 kg/m. Urinalysis: Protein Negative, Glucose Negative Fetal Status: Fetal Heart Rate (bpm): 126 Fundal Height: 42 cm Movement: Present     General:  Alert, oriented and cooperative. Patient is in no acute distress.  Skin: Skin is warm and dry. No rash noted.   Cardiovascular: Normal heart rate noted  Respiratory: Normal respiratory effort, no problems with respiration noted  Abdomen: Soft, gravid, appropriate for gestational age. Pain/Pressure: Present     Pelvic:  Cervical exam deferred        Extremities: Normal range of motion.     Mental Status: Normal mood and affect. Normal behavior. Normal judgment and thought content.    Assessment   37 y.o. [id number] at [redacted]w[redacted]d, EDD 02/19/2018 by Ultrasound presenting for a routine prenatal  visit.  Plan   Pregnancy#3 Problems (from 05/13/17 to present)    Problem Noted Resolved   Polyhydramnios 01/21/2018 by Will Bonnet, MD No   History of 2 cesarean sections 08/27/2017 by Malachy Mood, MD No   Pregnancy resulting from in vitro fertilization, antepartum 07/28/2017 by Rod Can, CNM No   Overview Addendum 11/08/2017  4:35 AM by Malachy Mood, MD    [x]  Fetal Echo 22 weeks negative      Supervision of high risk pregnancy, antepartum 07/28/2017 by Rod Can, CNM No   Overview Addendum 12/16/2017  4:00 PM by Malachy Mood, MD    Clinic [facility] Prenatal Labs  Dating Embryo transfer date Blood type: O/Positive/-- (03/13 1624)   Genetic Screen NIPS: 07/28/17 Normal XX Antibody:Negative (03/13 1624)  Anatomic [place] Complete, normal Rubella: 1.01 (03/13 1624) Varicella: Immune  GTT 98 RPR: Non Reactive (03/13 1624)   Rhogam N/A HBsAg: Negative (03/13 1624)   TDaP vaccine 12/16/17                    Flu Shot: received at work HIV: Non Reactive (03/13 1624)   Baby Food Breast                              GBS:   Contraception BTL Pap: 01/29/2016 NILM HPV negative  CBB     CS/VBAC x2 desires repeat   Support Person [name]              Gestational age appropriate obstetric precautions were reviewed.   Return in about 1 week (around 02/04/2018) for [place].  Avel Sensor, CNM 01/28/2018  4:27 PM

## 2018-01-28 NOTE — Progress Notes (Signed)
ROB- no concerns 

## 2018-01-31 ENCOUNTER — Telehealth: Payer: Self-pay | Admitting: Obstetrics and Gynecology

## 2018-01-31 NOTE — Telephone Encounter (Signed)
Patient is aware H&P at [facility] will remain on Wednesday, 02/02/18 @ 4:10pm w/ Dr. Georgianne Fick, and that she should arrive @ 9:30am at L&D on Thursday, 02/03/18, for Pre-admit labs and C/S at noon.

## 2018-02-01 LAB — STREP GP B CULTURE+RFLX: Strep Gp B Culture+Rflx: NEGATIVE

## 2018-02-02 ENCOUNTER — Encounter: Payer: Self-pay | Admitting: Obstetrics and Gynecology

## 2018-02-02 ENCOUNTER — Ambulatory Visit (INDEPENDENT_AMBULATORY_CARE_PROVIDER_SITE_OTHER): Payer: 59 | Admitting: Obstetrics and Gynecology

## 2018-02-02 ENCOUNTER — Encounter: Payer: Self-pay | Admitting: Maternal Newborn

## 2018-02-02 VITALS — BP 104/66 | HR 71 | Ht 60.0 in | Wt 160.0 lb

## 2018-02-02 DIAGNOSIS — O09819 Supervision of pregnancy resulting from assisted reproductive technology, unspecified trimester: Secondary | ICD-10-CM

## 2018-02-02 DIAGNOSIS — Z98891 History of uterine scar from previous surgery: Secondary | ICD-10-CM

## 2018-02-02 DIAGNOSIS — O34219 Maternal care for unspecified type scar from previous cesarean delivery: Secondary | ICD-10-CM

## 2018-02-02 DIAGNOSIS — O403XX0 Polyhydramnios, third trimester, not applicable or unspecified: Secondary | ICD-10-CM

## 2018-02-02 DIAGNOSIS — Z3A37 37 weeks gestation of pregnancy: Secondary | ICD-10-CM

## 2018-02-02 DIAGNOSIS — O09813 Supervision of pregnancy resulting from assisted reproductive technology, third trimester: Secondary | ICD-10-CM

## 2018-02-02 DIAGNOSIS — O0990 Supervision of high risk pregnancy, unspecified, unspecified trimester: Secondary | ICD-10-CM

## 2018-02-03 ENCOUNTER — Other Ambulatory Visit: Payer: Self-pay

## 2018-02-03 ENCOUNTER — Encounter
Admission: RE | Disposition: A | Discharge status: Home / Self Care | Payer: Self-pay | Source: Home / Self Care | Attending: Obstetrics and Gynecology

## 2018-02-03 ENCOUNTER — Ambulatory Visit: Payer: 59

## 2018-02-03 ENCOUNTER — Inpatient Hospital Stay: Admission: RE | Admit: 2018-02-03 | Payer: 59 | Source: Ambulatory Visit

## 2018-02-03 ENCOUNTER — Inpatient Hospital Stay
Admission: RE | Admit: 2018-02-03 | Discharge: 2018-02-06 | DRG: 784 | Disposition: A | Discharge status: Home / Self Care | Payer: 59 | Attending: Obstetrics and Gynecology | Admitting: Obstetrics and Gynecology

## 2018-02-03 ENCOUNTER — Inpatient Hospital Stay: Payer: 59 | Admitting: Anesthesiology

## 2018-02-03 DIAGNOSIS — Z8759 Personal history of other complications of pregnancy, childbirth and the puerperium: Secondary | ICD-10-CM | POA: Diagnosis not present

## 2018-02-03 DIAGNOSIS — O9081 Anemia of the puerperium: Secondary | ICD-10-CM | POA: Diagnosis not present

## 2018-02-03 DIAGNOSIS — D62 Acute posthemorrhagic anemia: Secondary | ICD-10-CM | POA: Diagnosis not present

## 2018-02-03 DIAGNOSIS — O09819 Supervision of pregnancy resulting from assisted reproductive technology, unspecified trimester: Secondary | ICD-10-CM

## 2018-02-03 DIAGNOSIS — Z3A37 37 weeks gestation of pregnancy: Secondary | ICD-10-CM

## 2018-02-03 DIAGNOSIS — O403XX0 Polyhydramnios, third trimester, not applicable or unspecified: Secondary | ICD-10-CM

## 2018-02-03 DIAGNOSIS — O34219 Maternal care for unspecified type scar from previous cesarean delivery: Secondary | ICD-10-CM

## 2018-02-03 DIAGNOSIS — Z302 Encounter for sterilization: Secondary | ICD-10-CM

## 2018-02-03 DIAGNOSIS — O409XX0 Polyhydramnios, unspecified trimester, not applicable or unspecified: Secondary | ICD-10-CM

## 2018-02-03 DIAGNOSIS — Z880 Allergy status to penicillin: Secondary | ICD-10-CM | POA: Diagnosis not present

## 2018-02-03 DIAGNOSIS — O0990 Supervision of high risk pregnancy, unspecified, unspecified trimester: Secondary | ICD-10-CM

## 2018-02-03 DIAGNOSIS — Z98891 History of uterine scar from previous surgery: Secondary | ICD-10-CM

## 2018-02-03 DIAGNOSIS — O09813 Supervision of pregnancy resulting from assisted reproductive technology, third trimester: Secondary | ICD-10-CM

## 2018-02-03 DIAGNOSIS — G8918 Other acute postprocedural pain: Secondary | ICD-10-CM | POA: Diagnosis not present

## 2018-02-03 DIAGNOSIS — O34211 Maternal care for low transverse scar from previous cesarean delivery: Secondary | ICD-10-CM | POA: Diagnosis present

## 2018-02-03 DIAGNOSIS — R1084 Generalized abdominal pain: Secondary | ICD-10-CM | POA: Diagnosis not present

## 2018-02-03 HISTORY — PX: TUBAL LIGATION: SHX77

## 2018-02-03 LAB — CBC
HCT: 35.2 % (ref 35.0–47.0)
Hemoglobin: 12.8 g/dL (ref 12.0–16.0)
MCH: 33.1 pg (ref 26.0–34.0)
MCHC: 36.4 g/dL — ABNORMAL HIGH (ref 32.0–36.0)
MCV: 90.9 fL (ref 80.0–100.0)
PLATELETS: 179 10*3/uL (ref 150–440)
RBC: 3.87 MIL/uL (ref 3.80–5.20)
RDW: 13.5 % (ref 11.5–14.5)
WBC: 9.1 10*3/uL (ref 3.6–11.0)

## 2018-02-03 LAB — TYPE AND SCREEN
ABO/RH(D): O POS
ANTIBODY SCREEN: NEGATIVE

## 2018-02-03 SURGERY — Surgical Case
Anesthesia: Spinal

## 2018-02-03 MED ORDER — SIMETHICONE 80 MG PO CHEW
80.0000 mg | CHEWABLE_TABLET | Freq: Three times a day (TID) | ORAL | Status: DC
  Administered 2018-02-04 – 2018-02-06 (×8): 80 mg via ORAL
  Filled 2018-02-03 (×7): qty 1

## 2018-02-03 MED ORDER — COCONUT OIL OIL: 1.0000 "application " | TOPICAL_OIL | Status: DC | PRN

## 2018-02-03 MED ORDER — OXYCODONE-ACETAMINOPHEN 5-325 MG PO TABS: 1.0000 | ORAL_TABLET | ORAL | Status: DC | PRN

## 2018-02-03 MED ORDER — FENTANYL CITRATE (PF) 100 MCG/2ML IJ SOLN: 25.0000 ug | INTRAMUSCULAR | Status: DC | PRN

## 2018-02-03 MED ORDER — BUPIVACAINE IN DEXTROSE 0.75-8.25 % IT SOLN
INTRATHECAL | Status: DC | PRN
  Administered 2018-02-03: 1.7 mL via INTRATHECAL

## 2018-02-03 MED ORDER — OXYCODONE-ACETAMINOPHEN 5-325 MG PO TABS
2.0000 | ORAL_TABLET | ORAL | Status: DC | PRN
  Administered 2018-02-04 – 2018-02-06 (×12): 2 via ORAL
  Filled 2018-02-03 (×12): qty 2

## 2018-02-03 MED ORDER — NALBUPHINE HCL 10 MG/ML IJ SOLN: 5.0000 mg | INTRAMUSCULAR | Status: DC | PRN

## 2018-02-03 MED ORDER — MORPHINE SULFATE (PF) 0.5 MG/ML IJ SOLN
INTRAMUSCULAR | Status: DC | PRN
  Administered 2018-02-03: .1 mg via INTRATHECAL

## 2018-02-03 MED ORDER — CEFAZOLIN SODIUM-DEXTROSE 2-4 GM/100ML-% IV SOLN
2.0000 g | INTRAVENOUS | Status: AC
  Administered 2018-02-03: 2 g via INTRAVENOUS
  Filled 2018-02-03: qty 100

## 2018-02-03 MED ORDER — LACTATED RINGERS IV SOLN: INTRAVENOUS | Status: DC

## 2018-02-03 MED ORDER — WITCH HAZEL-GLYCERIN EX PADS: 1.0000 "application " | MEDICATED_PAD | CUTANEOUS | Status: DC | PRN

## 2018-02-03 MED ORDER — METHYLERGONOVINE MALEATE 0.2 MG/ML IJ SOLN
INTRAMUSCULAR | Status: AC
  Filled 2018-02-03: qty 1

## 2018-02-03 MED ORDER — IBUPROFEN 600 MG PO TABS
600.0000 mg | ORAL_TABLET | Freq: Four times a day (QID) | ORAL | Status: DC | PRN
  Administered 2018-02-04 – 2018-02-06 (×7): 600 mg via ORAL
  Filled 2018-02-03 (×7): qty 1

## 2018-02-03 MED ORDER — EPHEDRINE 5 MG/ML INJ: 10.0000 mg | INTRAVENOUS | Status: AC

## 2018-02-03 MED ORDER — PROPOFOL 10 MG/ML IV BOLUS
INTRAVENOUS | Status: AC
  Filled 2018-02-03: qty 20

## 2018-02-03 MED ORDER — SIMETHICONE 80 MG PO CHEW
80.0000 mg | CHEWABLE_TABLET | ORAL | Status: DC
  Filled 2018-02-03: qty 1

## 2018-02-03 MED ORDER — SENNOSIDES-DOCUSATE SODIUM 8.6-50 MG PO TABS
2.0000 | ORAL_TABLET | ORAL | Status: DC
  Administered 2018-02-04 – 2018-02-05 (×2): 2 via ORAL
  Filled 2018-02-03 (×3): qty 2

## 2018-02-03 MED ORDER — KETOROLAC TROMETHAMINE 30 MG/ML IJ SOLN
30.0000 mg | Freq: Four times a day (QID) | INTRAMUSCULAR | Status: AC | PRN
  Administered 2018-02-03 – 2018-02-04 (×3): 30 mg via INTRAVENOUS
  Filled 2018-02-03 (×3): qty 1

## 2018-02-03 MED ORDER — LACTATED RINGERS IV SOLN
INTRAVENOUS | Status: DC | PRN
  Administered 2018-02-03: 12:00:00 via INTRAVENOUS

## 2018-02-03 MED ORDER — DIPHENHYDRAMINE HCL 50 MG/ML IJ SOLN: 12.5000 mg | INTRAMUSCULAR | Status: DC | PRN

## 2018-02-03 MED ORDER — BUPIVACAINE 0.25 % ON-Q PUMP DUAL CATH 400 ML
400.0000 mL | INJECTION | Status: DC
  Filled 2018-02-03: qty 400

## 2018-02-03 MED ORDER — BUPIVACAINE ON-Q PAIN PUMP (FOR ORDER SET NO CHG)
INJECTION | Status: DC
  Filled 2018-02-03: qty 1

## 2018-02-03 MED ORDER — ONDANSETRON HCL 4 MG/2ML IJ SOLN
INTRAMUSCULAR | Status: DC | PRN
  Administered 2018-02-03 (×2): 4 mg via INTRAVENOUS

## 2018-02-03 MED ORDER — DIPHENHYDRAMINE HCL 25 MG PO CAPS: 25.0000 mg | ORAL_CAPSULE | ORAL | Status: DC | PRN

## 2018-02-03 MED ORDER — OXYTOCIN 40 UNITS IN LACTATED RINGERS INFUSION - SIMPLE MED
INTRAVENOUS | Status: DC | PRN
  Administered 2018-02-03: 500 mL via INTRAVENOUS

## 2018-02-03 MED ORDER — ONDANSETRON HCL 4 MG/2ML IJ SOLN: 4.0000 mg | Freq: Once | INTRAMUSCULAR | Status: DC | PRN

## 2018-02-03 MED ORDER — FENTANYL CITRATE (PF) 100 MCG/2ML IJ SOLN
INTRAMUSCULAR | Status: DC | PRN
  Administered 2018-02-03: 15 ug via INTRATHECAL

## 2018-02-03 MED ORDER — OXYTOCIN 40 UNITS IN LACTATED RINGERS INFUSION - SIMPLE MED
2.5000 [IU]/h | INTRAVENOUS | Status: AC
  Administered 2018-02-03: 2.5 [IU]/h via INTRAVENOUS

## 2018-02-03 MED ORDER — DIPHENHYDRAMINE HCL 25 MG PO CAPS: 25.0000 mg | ORAL_CAPSULE | Freq: Four times a day (QID) | ORAL | Status: DC | PRN

## 2018-02-03 MED ORDER — ACETAMINOPHEN 325 MG PO TABS
650.0000 mg | ORAL_TABLET | ORAL | Status: DC | PRN
  Administered 2018-02-04: 650 mg via ORAL
  Filled 2018-02-03 (×2): qty 2

## 2018-02-03 MED ORDER — SOD CITRATE-CITRIC ACID 500-334 MG/5ML PO SOLN
ORAL | Status: AC
  Filled 2018-02-03: qty 15

## 2018-02-03 MED ORDER — SIMETHICONE 80 MG PO CHEW: 80.0000 mg | CHEWABLE_TABLET | ORAL | Status: DC | PRN

## 2018-02-03 MED ORDER — MORPHINE SULFATE (PF) 0.5 MG/ML IJ SOLN
INTRAMUSCULAR | Status: AC
  Filled 2018-02-03: qty 10

## 2018-02-03 MED ORDER — SODIUM CHLORIDE 0.9 % IV SOLN
INTRAVENOUS | Status: DC | PRN
  Administered 2018-02-03: 25 ug/min via INTRAVENOUS

## 2018-02-03 MED ORDER — NALBUPHINE HCL 10 MG/ML IJ SOLN: 5.0000 mg | Freq: Once | INTRAMUSCULAR | Status: DC | PRN

## 2018-02-03 MED ORDER — NALOXONE HCL 0.4 MG/ML IJ SOLN: 0.4000 mg | INTRAMUSCULAR | Status: DC | PRN

## 2018-02-03 MED ORDER — OXYTOCIN 40 UNITS IN LACTATED RINGERS INFUSION - SIMPLE MED
INTRAVENOUS | Status: AC
  Filled 2018-02-03: qty 1000

## 2018-02-03 MED ORDER — PRENATAL MULTIVITAMIN CH
1.0000 | ORAL_TABLET | Freq: Every day | ORAL | Status: DC
  Administered 2018-02-04 – 2018-02-06 (×3): 1 via ORAL
  Filled 2018-02-03 (×2): qty 1

## 2018-02-03 MED ORDER — SOD CITRATE-CITRIC ACID 500-334 MG/5ML PO SOLN
30.0000 mL | ORAL | Status: AC
  Administered 2018-02-03: 30 mL via ORAL
  Filled 2018-02-03: qty 15

## 2018-02-03 MED ORDER — CARBOPROST TROMETHAMINE 250 MCG/ML IM SOLN
INTRAMUSCULAR | Status: AC
  Filled 2018-02-03: qty 1

## 2018-02-03 MED ORDER — NALOXONE HCL 4 MG/10ML IJ SOLN
1.0000 ug/kg/h | INTRAVENOUS | Status: DC | PRN
  Filled 2018-02-03: qty 5

## 2018-02-03 MED ORDER — ONDANSETRON HCL 4 MG/2ML IJ SOLN: 4.0000 mg | Freq: Three times a day (TID) | INTRAMUSCULAR | Status: DC | PRN

## 2018-02-03 MED ORDER — EPHEDRINE 5 MG/ML INJ
INTRAVENOUS | Status: AC
  Administered 2018-02-03: 5 mg
  Filled 2018-02-03: qty 4

## 2018-02-03 MED ORDER — DEXAMETHASONE SODIUM PHOSPHATE 10 MG/ML IJ SOLN
INTRAMUSCULAR | Status: DC | PRN
  Administered 2018-02-03: 10 mg via INTRAVENOUS

## 2018-02-03 MED ORDER — LACTATED RINGERS IV BOLUS
500.0000 mL | Freq: Once | INTRAVENOUS | Status: AC
  Administered 2018-02-03: 500 mL via INTRAVENOUS

## 2018-02-03 MED ORDER — BUPIVACAINE HCL (PF) 0.5 % IJ SOLN
20.0000 mL | INTRAMUSCULAR | Status: DC
  Filled 2018-02-03: qty 30

## 2018-02-03 MED ORDER — DIBUCAINE 1 % RE OINT: 1.0000 "application " | TOPICAL_OINTMENT | RECTAL | Status: DC | PRN

## 2018-02-03 MED ORDER — SODIUM CHLORIDE 0.9% FLUSH: 3.0000 mL | INTRAVENOUS | Status: DC | PRN

## 2018-02-03 MED ORDER — FENTANYL CITRATE (PF) 100 MCG/2ML IJ SOLN
INTRAMUSCULAR | Status: AC
  Filled 2018-02-03: qty 2

## 2018-02-03 MED ORDER — PHENYLEPHRINE HCL 10 MG/ML IJ SOLN
INTRAMUSCULAR | Status: DC | PRN
  Administered 2018-02-03: 100 ug via INTRAVENOUS
  Administered 2018-02-03: 50 ug via INTRAVENOUS

## 2018-02-03 MED ORDER — MENTHOL 3 MG MT LOZG
1.0000 | LOZENGE | OROMUCOSAL | Status: DC | PRN
  Filled 2018-02-03: qty 9

## 2018-02-03 MED ORDER — KETOROLAC TROMETHAMINE 30 MG/ML IJ SOLN: 30.0000 mg | Freq: Four times a day (QID) | INTRAMUSCULAR | Status: AC | PRN

## 2018-02-03 SURGICAL SUPPLY — 29 items
BAG COUNTER SPONGE EZ (MISCELLANEOUS) ×2 IMPLANT
BARRIER ADH SEPRAFILM 3INX5IN (MISCELLANEOUS) ×3 IMPLANT
CANISTER SUCT 3000ML PPV (MISCELLANEOUS) ×3 IMPLANT
CATH KIT ON-Q SILVERSOAK 5IN (CATHETERS) ×6 IMPLANT
CHLORAPREP W/TINT 26ML (MISCELLANEOUS) ×6 IMPLANT
CLOSURE WOUND 1/2 X4 (GAUZE/BANDAGES/DRESSINGS) ×1
COUNTER SPONGE BAG EZ (MISCELLANEOUS) ×1
DERMABOND ADVANCED (GAUZE/BANDAGES/DRESSINGS) ×4
DERMABOND ADVANCED .7 DNX12 (GAUZE/BANDAGES/DRESSINGS) ×2 IMPLANT
DRSG OPSITE POSTOP 4X10 (GAUZE/BANDAGES/DRESSINGS) ×3 IMPLANT
DRSG TELFA 3X8 NADH (GAUZE/BANDAGES/DRESSINGS) ×3 IMPLANT
ELECT CAUTERY BLADE 6.4 (BLADE) ×3 IMPLANT
ELECT REM PT RETURN 9FT ADLT (ELECTROSURGICAL) ×3
ELECTRODE REM PT RTRN 9FT ADLT (ELECTROSURGICAL) ×1 IMPLANT
GAUZE SPONGE 4X4 12PLY STRL (GAUZE/BANDAGES/DRESSINGS) ×3 IMPLANT
GLOVE BIO SURGEON STRL SZ7 (GLOVE) ×3 IMPLANT
GLOVE INDICATOR 7.5 STRL GRN (GLOVE) ×3 IMPLANT
GOWN STRL REUS W/ TWL LRG LVL3 (GOWN DISPOSABLE) ×3 IMPLANT
GOWN STRL REUS W/TWL LRG LVL3 (GOWN DISPOSABLE) ×6
NS IRRIG 1000ML POUR BTL (IV SOLUTION) ×3 IMPLANT
PACK C SECTION AR (MISCELLANEOUS) ×3 IMPLANT
PAD OB MATERNITY 4.3X12.25 (PERSONAL CARE ITEMS) ×3 IMPLANT
PAD PREP 24X41 OB/GYN DISP (PERSONAL CARE ITEMS) ×3 IMPLANT
STRIP CLOSURE SKIN 1/2X4 (GAUZE/BANDAGES/DRESSINGS) ×2 IMPLANT
SUT MNCRL AB 4-0 PS2 18 (SUTURE) ×6 IMPLANT
SUT PDS AB 1 TP1 96 (SUTURE) ×6 IMPLANT
SUT VIC AB 0 CTX 36 (SUTURE) ×4
SUT VIC AB 0 CTX36XBRD ANBCTRL (SUTURE) ×2 IMPLANT
SUT VIC AB 2-0 CT1 36 (SUTURE) ×3 IMPLANT

## 2018-02-03 NOTE — Op Note (Signed)
Preoperative Diagnosis: 1) 37 y.o. [id number] at [redacted]w[redacted]d 2) Desires permanent surgical sterilization 3) History of two prior Cesarean section 4) Moderate polyhydramnios with dyspnea  Postoperative Diagnosis: 1) 37 y.o. [id number] at [redacted]w[redacted]d 2) Desires permanent surgical sterilization 3) History of two prior Cesarean section 4) Moderate polyhydramnios with dyspnea  Operation Performed: Repeat C-section T-shaped uterine incision via pfannenstiel skin incision and Pomeroy tubal ligation right, Parkland tubal left  Anesthesia: Spinal  Primary Surgeon: Malachy Mood, MD  Assistant: Miguel Rota, CNM  Preoperative Antibiotics: 2g ancef  Estimated Blood Loss: 800 mL  IV Fluids: 1575mL  Urine Output:: 875mL  Drains or Tubes: Foley to gravity drainage, ON-Q catheter system  Implants: none  Specimens Removed: bilateral segments of tube  Complications: none  Intraoperative Findings:  Normal tubes ovaries and uterus.  Delivery resulted in the birth of a liveborn female, APGAR (1 MIN): 4   APGAR (5 MINS): 8, weight 7lbs 1oz  Patient Condition: stable  Procedure in Detail:  Patient was taken to the operating room were she was administered regional anesthesia.  She was positioned in the supine position, prepped and draped in the  Usual sterile fashion.  Prior to proceeding with the case a time out was performed and the level of anesthetic was checked and noted to be adequate.  Utilizing the scalpel a pfannenstiel skin incision was made 2cm above the pubic symphysis utilizing the patient's pre-existing scar and carried down sharply to the the level of the rectus fascia.  The fascia was incised in the midline using the scalpel and then extended using mayo scissors.  The superior border of the rectus fascia was grasped with two Kocher clamps and the underlying rectus muscles were dissected of the fascia using blunt dissection.  The median raphae was incised using Mayo scissors.   The inferior border  of the rectus fascia was dissected of the rectus muscles in a similar fashion.  The midline was identified, the peritoneum was entered bluntly and expanded using manual tractions.  The uterus was noted to be in a none rotated position.  Next the bladder blade was placed retracting the bladder caudally.  A bladder flap was  created.  A low transverse incision was scored on the lower uterine segment.  The hysterotomy was entered bluntly using the operators finger.  The hysterotomy incision was extended using manual traction.  The operators hand was placed within the hysterotomy position noting the fetus to be within the OA position, slightly transvere.  The vertex was grasped, flexed, but unable to be brought down to the incision using fundal pressure.  A vacuum was applied but resulted in two pop offs.  There was sufficient room to allow internal version to to breech.  The feet were grasped and delivered.  Infant was delivered to level of scapula.  The right arm was splinted and delivered, fetus was rotated 180 degrees and the left arm was delivered in similar fashion.  The vertex was flexed head was flexed but a T-shaped incision was made with bandage scissors to facilitate delivery of the fetal head.  The infant was suctioned, cord was clamped and cut before handing off to the awaiting neonatologist.  The placenta was delivered using manual extraction.  The uterus was exteriorized, wiped clean of clots and debris using two moist laps.  The hysterotomy was closed using a two layer closure of 0 Vicryl, with the first being a running locked, the second a vertical imbricating.  The T-shaped extension was closed in 3  layers of 0 Vicryl imbricating.  The serosa was closed with a baseball stitch using 4-0 Monocryl.  The right tube was grasped in a mid isthmic portion using a Babcock clamp, before being double suture ligated using a 0 chromic wheel.  The intervening nuckel of tube was excised using Metzenbaum scissors.   Complete cross section of tubal ostia visualized, and noted to be hemostatic  This procedure was then repeated in but given some fusion of the tube the left tube was ligated using a Parkland approach.  A window was created in the mesosalpinx.  The tube was double suture ligated in the distal and proximal portion and the intervening segment excised.   The uterus was returned to the abdomen.  The peritoneal gutters were wiped clean of clots and debris using two moist laps.  The hysterotomy incision and tubal pedicles were re-inspected noted to be hemostatic.  The rectus muscles were re-approximated in the midline using a single 2-0 Vicryl mattress stitch.  The rectus muscles were inspected noted to be hemostatic.  The superior border of the rectus fascia was grasped with a Kocher clamp.  The ON-Q trocars were then placed 4cm above the superior border of the incision and tunneled subfascially.  The introducers were removed and the catheters were threaded through the sleeves after which the sleeves were removed.  The fascia was closed using a looped #1 PDS in a running fashion taking 1cm by 1cm bites.  The subcutaneous tissue was irrigated using warm saline, hemostasis achieved using the bovie.  The subcutaneous dead space was less than 3cm and was not closed.  The skin was closed using 2-0 Vicryl subdermal stitches to take tension of the incision.  The skin was then closed using 4-0 Monocryl in a subcuticular fashion.  Sponge needle and instrument counts were corrects times two.  The patient tolerated the procedure well and was taken to the recovery room in stable condition.

## 2018-02-03 NOTE — Anesthesia Procedure Notes (Addendum)
Spinal  Start time: 02/03/2018 12:09 PM End time: 02/03/2018 12:14 PM Staffing Anesthesiologist: Martha Clan, MD Resident/CRNA: Aline Brochure, CRNA Performed: anesthesiologist  Preanesthetic Checklist Completed: patient identified, site marked, surgical consent, pre-op evaluation, IV checked, risks and benefits discussed and monitors and equipment checked Spinal Block Patient position: sitting Prep: ChloraPrep Patient monitoring: heart rate, continuous pulse ox and blood pressure Approach: midline Location: L3-4 Injection technique: single-shot Needle Needle type: Pencan  Needle gauge: 24 G Needle length: 9 cm

## 2018-02-03 NOTE — Anesthesia Preprocedure Evaluation (Signed)
Anesthesia Evaluation  Patient identified by MRN, date of birth, ID band Patient awake    Reviewed: Allergy & Precautions, NPO status , Patient's Chart, lab work & pertinent test results  History of Anesthesia Complications (+) PONV and history of anesthetic complications  Airway Mallampati: I  TM Distance: >3 FB Neck ROM: Full    Dental no notable dental hx. (+) Dental Advidsory Given   Pulmonary neg pulmonary ROS, neg sleep apnea, neg COPD,    breath sounds clear to auscultation- rhonchi (-) wheezing      Cardiovascular Exercise Tolerance: Good (-) hypertension(-) CAD and (-) Past MI  Rhythm:Regular Rate:Normal - Systolic murmurs and - Diastolic murmurs    Neuro/Psych negative neurological ROS  negative psych ROS   GI/Hepatic Neg liver ROS, GERD  ,  Endo/Other  negative endocrine ROSneg diabetes  Renal/GU negative Renal ROS     Musculoskeletal negative musculoskeletal ROS (+)   Abdominal (+) - obese, Gravid abdomen   Peds  Hematology negative hematology ROS (+)   Anesthesia Other Findings Past Medical History: No date: GERD (gastroesophageal reflux disease)     Comment:  during pregnancy No date: PONV (postoperative nausea and vomiting)     Comment:  nausea with laparoscopy   Reproductive/Obstetrics (+) Pregnancy                             Anesthesia Physical  Anesthesia Plan  ASA: II  Anesthesia Plan: Spinal   Post-op Pain Management:    Induction:   PONV Risk Score and Plan:   Airway Management Planned: Nasal Cannula and Natural Airway  Additional Equipment:   Intra-op Plan:   Post-operative Plan:   Informed Consent: I have reviewed the patients History and Physical, chart, labs and discussed the procedure including the risks, benefits and alternatives for the proposed anesthesia with the patient or authorized representative who has indicated his/her understanding  and acceptance.     Plan Discussed with: CRNA and Anesthesiologist  Anesthesia Plan Comments:         Anesthesia Quick Evaluation

## 2018-02-03 NOTE — H&P (Signed)
Date of Initial H&P: 02/02/18  History reviewed, patient examined, no change in status, stable for surgery.

## 2018-02-03 NOTE — Anesthesia Post-op Follow-up Note (Signed)
Anesthesia QCDR form completed.        

## 2018-02-03 NOTE — Lactation Note (Signed)
This note was copied from a baby's chart. Lactation Consultation Note  Patient Name: Girl Sharnette Kitamura Today's Date: 02/03/2018 Reason for consult: Initial assessment   Maternal Data    Feeding Feeding Type: Breast Fed  LATCH Score Latch: Grasps breast easily, tongue down, lips flanged, rhythmical sucking.  Audible Swallowing: A few with stimulation  Type of Nipple: Everted at rest and after stimulation  Comfort (Breast/Nipple): Soft / non-tender  Hold (Positioning): Assistance needed to correctly position infant at breast and maintain latch.  LATCH Score: 8  Interventions Interventions: Breast feeding basics reviewed;Assisted with latch;Adjust position;Support pillows  Lactation Tools Discussed/Used     Consult Status  LC to room to assist with breastfeeding. This is mother's 3rd baby and she states that she breastfed her last child for 4 months. Dexter assisted with latch and positioning. Infant was able to successfully latch with no pain or discomfort for mother. Mother denies any additional concerns at this time. Ambler left contact information and encouraged her to call if she needs further assistance.    Elvera Lennox 01/13/9370, 3:34 PM

## 2018-02-03 NOTE — Progress Notes (Signed)
Obstetric H&P   Chief Complaint: Scheduled C-section  Prenatal Care Provider: [facility]  History of Present Illness: 37 y.o. [id number] [redacted]w[redacted]d by 5 day embryo transfer date derived EDD of 02/19/2018, presenting to L&D for scheduled repeat Cesarean section.  The patient began developing polyhydramnios in 3rd trimester with increasing dyspnea and abdominal discomfort over the past 2 weeks.  +FM, no LOF, no VB  EFW 7lbs 1oz c/w 69%ile at 36 weeks 01/27/18   Pregravid weight 130 lb (59 kg) Total Weight Gain 29 lb (13.2 kg)  Pregnancy#3 Problems (from 05/13/17 to present)    Problem Noted Resolved   Polyhydramnios 01/21/2018 by Will Bonnet, MD No   History of 2 cesarean sections 08/27/2017 by Malachy Mood, MD No   Pregnancy resulting from in vitro fertilization, antepartum 07/28/2017 by Rod Can, CNM No   Overview Addendum 11/08/2017  4:35 AM by Malachy Mood, MD    [x]  Fetal Echo 22 weeks negative      Supervision of high risk pregnancy, antepartum 07/28/2017 by Rod Can, CNM No   Overview Addendum 12/16/2017  4:00 PM by Malachy Mood, MD    Clinic [facility] Prenatal Labs  Dating Embryo transfer date Blood type: O/Positive/-- (03/13 1624)   Genetic Screen NIPS: 07/28/17 Normal XX Antibody:Negative (03/13 1624)  Anatomic [place] Complete, normal Rubella: 1.01 (03/13 1624) Varicella: Immune  GTT 98 RPR: Non Reactive (03/13 1624)   Rhogam N/A HBsAg: Negative (03/13 1624)   TDaP vaccine 12/16/17                    Flu Shot: received at work HIV: Non Reactive (03/13 1624)   Baby Food Breast                              GBS:   Contraception BTL Pap: 01/29/2016 NILM HPV negative  CBB     CS/VBAC x2 desires repeat   Support Person [name]               Review of Systems: 10 point review of systems negative unless otherwise noted in HPI  Past Medical History: Past Medical History:  Diagnosis Date  . GERD (gastroesophageal reflux disease)    during pregnancy  .  PONV (postoperative nausea and vomiting)    nausea with laparoscopy    Past Surgical History: Past Surgical History:  Procedure Laterality Date  . CESAREAN SECTION  02/2014  . CESAREAN SECTION N/A 12/19/2015   Procedure: CESAREAN SECTION;  Surgeon: Malachy Mood, MD;  Location: [facility] ORS;  Service: Obstetrics;  Laterality: N/A;  Time of Birth 08:24 Sex: female Weight: 8 lb 2 oz  . diagnoastic lap  2013  . DILATION AND CURETTAGE OF UTERUS    . endometriosis     history of  . HERNIA REPAIR Right 1984  . INTRAUTERINE DEVICE (IUD) INSERTION  2015  . IUD REMOVAL  2016    Past Obstetric History: #: 1, Date: None, Sex: None, Weight: None, GA: None, Delivery: None, Apgar1: None, Apgar5: None, Living: None, Birth Comments: None  #: 2, Date: 12/19/15, Sex: Female, Weight: 8 lb 1.8 oz (3.68 kg), GA: [redacted]w[redacted]d, Delivery: C-Section, Low Transverse, Apgar1: 8, Apgar5: 9, Living: Living, Birth Comments: None  #: 3, Date: None, Sex: None, Weight: None, GA: None, Delivery: None, Apgar1: None, Apgar5: None, Living: None, Birth Comments: None  Family History: No family history on file.  Social History: Social History   Socioeconomic  History  . Marital status: Married    Spouse name: Not on file  . Number of children: Not on file  . Years of education: Not on file  . Highest education level: Not on file  Occupational History  . Occupation: Therapist, sports -[employer]    Employer: [employer]  . Financial resource strain: Not on file  . Food insecurity:    Worry: Not on file    Inability: Not on file  . Transportation needs:    Medical: Not on file    Non-medical: Not on file  Tobacco Use  . Smoking status: Never Smoker  . Smokeless tobacco: Never Used  Substance and Sexual Activity  . Alcohol use: No  . Drug use: No  . Sexual activity: Yes    Birth control/protection: IUD  Lifestyle  . Physical activity:    Days per week: Not on file    Minutes per session: Not on file  . Stress: Not on  file  Relationships  . Social connections:    Talks on phone: Not on file    Gets together: Not on file    Attends religious service: Not on file    Active member of club or organization: Not on file    Attends meetings of clubs or organizations: Not on file    Relationship status: Not on file  . Intimate partner violence:    Fear of current or ex partner: Not on file    Emotionally abused: Not on file    Physically abused: Not on file    Forced sexual activity: Not on file  Other Topics Concern  . Not on file  Social History Narrative  . Not on file    Medications: Prior to Admission medications   Medication Sig Start Date End Date Taking? Authorizing Provider  omeprazole (PRILOSEC OTC) 20 MG tablet Take 20 mg by mouth daily.     [provider]  Prenatal Vit-Fe Fumarate-FA (MULTIVITAMIN-PRENATAL) 27-0.8 MG TABS tablet Take 1 tablet by mouth daily.     [provider]    Allergies: Allergies  Allergen Reactions  . Zantac [Ranitidine Hcl] Anaphylaxis  . Penicillins Rash    Has patient had a PCN reaction causing immediate rash, facial/tongue/throat swelling, SOB or lightheadedness with hypotension: Yes Has patient had a PCN reaction causing severe rash involving mucus membranes or skin necrosis: Yes Has patient had a PCN reaction that required hospitalization No Has patient had a PCN reaction occurring within the last 10 years: No If all of the above answers are "NO", then may proceed with Cephalosporin use.   . Procardia [Nifedipine] Rash    Physical Exam: Vitals: Blood pressure 104/66, pulse 71, height 5' (1.524 m), weight 160 lb (72.6 kg), last menstrual period 05/13/2017, unknown if currently breastfeeding.  Urine Dip Protein: N/A  FHT: 145  General: NAD HEENT: normocephalic, anicteric Pulmonary: No increased work of breathing Cardiovascular: RRR, distal pulses 2+ Abdomen: Gravid, non-tender, fundal height 40 Extremities: no edema, erythema,  or tenderness Neurologic: Grossly intact Psychiatric: mood appropriate, affect full  Labs: No results found for this or any previous visit (from the past 24 hour(s)).  Assessment: 37 y.o. [id number] [redacted]w[redacted]d by 02/19/2018, by embryo transfer date presenting for repeat Cesarean section in the setting of symptomatic polyhydramnios  Plan: 1) The patient was counseled regarding risk and benefits to proceeding with Cesarean section to expedite delivery.  Risk of cesarean section were discussed including risk of bleeding and need for potential  intraoperative or postoperative blood transfusion with a rate of approximately 5% quoted for all Cesarean sections, risk of injury to adjacent organs including but not limited to bowl and bladder, the need for additional surgical procedures to address such injuries, and the risk of infection. We discussed potential issues around transitioning of baby after delivery in setting of early term and polyhydramnios.   - methergine, hemabate, and cytotec in room given poly - will ultrasound for position prior to going back for section  2) Fetus - +FHT  3) PNL - Blood type O/Positive/-- (03/13 1624) / Anti-bodyscreen Negative (03/13 1624) / Rubella 1.01 (03/13 1624) / Varicella Immune / RPR Non Reactive (07/16 1126) / HBsAg Negative (03/13 1624) / HIV Non Reactive (07/16 1126) / 1-hr OGTT 98 / GBS    4) Immunization History -  Immunization History  Administered Date(s) Administered  . Tdap 12/16/2017    5) Disposition - pending delivery  Malachy Mood, MD, [place] 02/03/2018, 7:32 AM

## 2018-02-03 NOTE — Transfer of Care (Signed)
Immediate Anesthesia Transfer of Care Note  Patient: Tiffany [name]  Procedure(s) Performed: CESAREAN [name] WITH BILATERAL TUBAL LIGATION (N/A )  Patient Location: PACU  Anesthesia Type:Spinal  Level of Consciousness: awake, alert  and oriented  Airway & Oxygen Therapy: Patient Spontanous Breathing  Post-op Assessment: Post -op Vital signs reviewed and stable  Post vital signs: stable  Last Vitals:  Vitals Value Taken Time  BP    Temp    Pulse    Resp    SpO2      Last Pain:  Vitals:   02/03/18 1042  TempSrc:   PainSc: 0-No pain         Complications: No apparent anesthesia complications

## 2018-02-04 ENCOUNTER — Encounter: Payer: Self-pay | Admitting: Obstetrics and Gynecology

## 2018-02-04 LAB — CBC
HEMATOCRIT: 26.4 % — AB (ref 35.0–47.0)
HEMOGLOBIN: 9.6 g/dL — AB (ref 12.0–16.0)
MCH: 33.2 pg (ref 26.0–34.0)
MCHC: 36.4 g/dL — AB (ref 32.0–36.0)
MCV: 91.4 fL (ref 80.0–100.0)
Platelets: 173 10*3/uL (ref 150–440)
RBC: 2.89 MIL/uL — ABNORMAL LOW (ref 3.80–5.20)
RDW: 13.1 % (ref 11.5–14.5)
WBC: 12 10*3/uL — ABNORMAL HIGH (ref 3.6–11.0)

## 2018-02-04 LAB — RPR: RPR: NONREACTIVE

## 2018-02-04 MED ORDER — OXYCODONE HCL 5 MG PO TABS: 5.0000 mg | ORAL_TABLET | ORAL | Status: DC | PRN

## 2018-02-04 NOTE — Anesthesia Post-op Follow-up Note (Signed)
  Anesthesia Pain Follow-up Note  Patient: Tiffany [name]  Day #: 1  Date of Follow-up: 02/04/2018 Time: 7:41 AM  Last Vitals:  Vitals:   02/03/18 2259 02/04/18 0414  BP: 102/69 100/67  Pulse: 70 72  Resp: 20 18  Temp: 36.6 C 36.4 C  SpO2: 98% 100%    Level of Consciousness: alert  Pain: mild   Side Effects:None  Catheter Site Exam:clean, dry     Plan: D/C from anesthesia care at surgeon's request  Blima Singer

## 2018-02-04 NOTE — Anesthesia Postprocedure Evaluation (Addendum)
Anesthesia Post Note  Patient: Tiffany [name]  Procedure(s) Performed: CESAREAN [name] WITH BILATERAL TUBAL LIGATION (N/A )  Anesthesia Type: Spinal Level of consciousness: awake and alert and oriented Pain management: satisfactory to patient Vital Signs Assessment: post-procedure vital signs reviewed and stable Respiratory status: respiratory function stable Cardiovascular status: stable Postop Assessment: no headache, no backache, no apparent nausea or vomiting, spinal receding, patient able to bend at knees, adequate PO intake and able to ambulate Anesthetic complications: no     Last Vitals:  Vitals:   02/03/18 2259 02/04/18 0414  BP: 102/69 100/67  Pulse: 70 72  Resp: 20 18  Temp: 36.6 C 36.4 C  SpO2: 98% 100%    Last Pain:  Vitals:   02/04/18 0526  TempSrc:   PainSc: Ambrose Mantle

## 2018-02-04 NOTE — Lactation Note (Signed)
This note was copied from a baby's chart. Lactation Consultation Note  Patient Name: Girl Kathya Wilz [id number] Date: 02/04/2018 Reason for consult: Follow-up assessment   Maternal Data  given Medela PNS through [facility] insurance  Feeding Feeding Type: Breast Fed Length of feed: (few sucks)  LATCH Score Latch: Grasps breast easily, tongue down, lips flanged, rhythmical sucking.  Audible Swallowing: Spontaneous and intermittent  Type of Nipple: Everted at rest and after stimulation  Comfort (Breast/Nipple): Soft / non-tender  Hold (Positioning): Assistance needed to correctly position infant at breast and maintain latch.  LATCH Score: 9  Interventions Interventions: Assisted with latch;Hand express;Adjust position;Support pillows  Lactation Tools Discussed/Used     Consult Status Consult Status: Follow-up Date: 02/05/18 Follow-up type: In-patient    Ferol Luz 02/04/2018, 7:21 PM

## 2018-02-04 NOTE — Progress Notes (Signed)
Subjective:  Doing well no concerns.  Pain well controlled on oral analgesics.   Tolerating po.  Objective:  Vital signs in last 24 hours: Temp:  [96 F (35.6 C)-98.4 F (36.9 C)] 98.4 F (36.9 C) (09/20 0800) Pulse Rate:  [53-115] 79 (09/20 0800) Resp:  [9-30] 18 (09/20 0800) BP: (64-118)/(15-86) 105/74 (09/20 0800) SpO2:  [96 %-100 %] 98 % (09/20 0800)    Intake/Output      09/19 0701 - 09/20 0700 09/20 0701 - 09/21 0700   P.O.  120   I.V. (mL/kg) 1000 (14.1) 1725 (24.4)   Total Intake(mL/kg) 1000 (14.1) 1845 (26.1)   Urine (mL/kg/hr) 1550 850 (2.2)   Blood 970    Total Output 2520 850   Net -1520 +995        Urine Occurrence 1 x      General: NAD Pulmonary: no increased work of breathing Abdomen: non-distended, non-tender, fundus firm at level of umbilicus Incision: Dressing with some blood on it and peeling up.  Removed.  Incision D/C/I suture line.   Extremities: no edema, no erythema, no tenderness  Results for orders placed or performed during the hospital encounter of 02/03/18 (from the past 72 hour(s))  Type and screen [place]     Status: None   Collection Time: 02/03/18 11:22 AM  Result Value Ref Range   ABO/RH(D) O POS    Antibody Screen NEG    Sample Expiration      02/06/2018 Performed at [facility], [address] [place], [place] [address]   CBC     Status: Abnormal   Collection Time: 02/03/18 11:22 AM  Result Value Ref Range   WBC 9.1 3.6 - 11.0 K/uL   RBC 3.87 3.80 - 5.20 MIL/uL   Hemoglobin 12.8 12.0 - 16.0 g/dL    Comment: RESULT REPEATED AND VERIFIED   HCT 35.2 35.0 - 47.0 %    Comment: RESULT REPEATED AND VERIFIED   MCV 90.9 80.0 - 100.0 fL   MCH 33.1 26.0 - 34.0 pg   MCHC 36.4 (H) 32.0 - 36.0 g/dL   RDW 13.5 11.5 - 14.5 %   Platelets 179 150 - 440 K/uL    Comment: Performed at [facility], [address] [place], [place] [address]  RPR     Status: None   Collection Time: 02/03/18  11:22 AM  Result Value Ref Range   RPR Ser Ql Non Reactive Non Reactive    Comment: (NOTE) Performed At: [facility] [place], [place] [id number] Rush Farmer MD WP:[id number]   CBC     Status: Abnormal   Collection Time: 02/04/18  5:36 AM  Result Value Ref Range   WBC 12.0 (H) 3.6 - 11.0 K/uL   RBC 2.89 (L) 3.80 - 5.20 MIL/uL   Hemoglobin 9.6 (L) 12.0 - 16.0 g/dL    Comment: RESULT REPEATED AND VERIFIED   HCT 26.4 (L) 35.0 - 47.0 %   MCV 91.4 80.0 - 100.0 fL   MCH 33.2 26.0 - 34.0 pg   MCHC 36.4 (H) 32.0 - 36.0 g/dL   RDW 13.1 11.5 - 14.5 %   Platelets 173 150 - 440 K/uL    Comment: Performed at [facility], [address], [place], [place] [address]    Immunization History  Administered Date(s) Administered  . Tdap 12/16/2017    Assessment:   37 y.o. [id number] postoperativeday # 1 RLTCS & BTL   Plan:  )  Acute blood loss anemia - hemodynamically stable and asymptomatic - po ferrous sulfate  2) Blood Type --/--/O POS (09/19 1122) / Rubella 1.01 (03/13 1624) / Varicella Immune  3) TDAP status up to date  4) Feeding plan breast  5)  Education given regarding options for contraception, as well as compatibility with breast feeding if applicable.  Status post BTL for contraception  6) Disposition - anticipate discharge [place], MD, [place] 02/04/2018, 12:30 PM

## 2018-02-05 NOTE — Progress Notes (Signed)
Admit Date: 02/03/2018 Today's Date: 02/05/2018  Subjective: Postpartum Day 2: Cesarean Delivery Patient reports tolerating PO, + flatus, + BM and no problems voiding.    Objective: Vital signs in last 24 hours: Temp:  [98 F (36.7 C)-98.7 F (37.1 C)] 98 F (36.7 C) (09/21 0817) Pulse Rate:  [71-85] 85 (09/21 0817) Resp:  [18] 18 (09/21 0817) BP: (86-120)/(57-82) 106/76 (09/21 0817) SpO2:  [97 %-100 %] 100 % (09/21 0817)  Physical Exam:  General: alert, cooperative and appears stated age Lochia: appropriate Uterine Fundus: firm Incision: healing well, no significant drainage, no dehiscence, no significant erythema DVT Evaluation: No evidence of DVT seen on physical exam. Negative Homan's sign.  Recent Labs    02/03/18 1122 02/04/18 0536  HGB 12.8 9.6*  HCT 35.2 26.4*    Assessment/Plan: Status post Cesarean section. Doing well postoperatively.  Continue current care. Breast feeding well Mild anemia, iron S/p tubal  Hoyt Koch 02/05/2018, 9:12 AM

## 2018-02-05 NOTE — Progress Notes (Addendum)
Reassessed On-Q pump site, dressing was last changed at 0900. Gauze and steri-strips are completely saturated with bloody, sanguinous drainage. Paged MD. Per Dr. [name], change dressing and strips as needed, plan to reassess in 2 hours. May need intervention at that time if continues oozing. Pt agreeable. Cleaned and dressing (4x4 gauze; Tegaderm) and steri-strips reapplied. Incision is unremarkable, no drainage noted. Will continue to monitor.

## 2018-02-06 MED ORDER — OXYCODONE-ACETAMINOPHEN 5-325 MG PO TABS: 1.0000 | ORAL_TABLET | ORAL | 0 refills | Status: DC | PRN

## 2018-02-06 NOTE — Progress Notes (Signed)
Patient states that she received TDaP vaccine during pregnancy and Influenza vaccine through [facility]. Reed Breech, RN 02/06/2018 11:22 AM

## 2018-02-06 NOTE — Progress Notes (Signed)
Discharge instructions provided.  Pt and sig other verbalize understanding of all instructions and follow-uip care.  Incision hygiene kit given.  Pt discharged to home with infant at 1345 on 02/06/18 via wheelchair by CNA. Reed Breech, RN 02/06/2018 7:51 PM

## 2018-02-06 NOTE — Discharge Instructions (Signed)
Call your doctor for increased pain or vaginal bleeding, temperature above 100.4, depression, or concerns.  Increase calories and fluids while breastfeeding.  Continue prenatal vitamin and iron.  Keep incision clean and dry.  Please follow instructions included in incision hygiene kit.  Call your doctor for incision concerns including redness, swelling, bleeding, or drainage, or if begins to come apart.  No strenuous activity or heavy lifting for 6 weeks.  No intercourse, tampons, or douching for 6 weeks.  No tub baths- showers only.  No driving for 2 weeks or while taking pain medications.      Cesarean Delivery, Care After Refer to this sheet in the next few weeks. These instructions provide you with information about caring for yourself after your procedure. Your health care provider may also give you more specific instructions. Your treatment has been planned according to current medical practices, but problems sometimes occur. Call your health care provider if you have any problems or questions after your procedure. What can I expect after the procedure? After the procedure, it is common to have:  A small amount of blood or clear fluid coming from the incision.  Some redness, swelling, and pain in your incision area.  Some abdominal pain and soreness.  Vaginal bleeding (lochia).  Pelvic cramps.  Fatigue.  Follow these instructions at home: Incision care   Follow instructions from your health care provider about how to take care of your incision. Make sure you: ? Wash your hands with soap and water before you change your bandage (dressing). If soap and water are not available, use hand sanitizer. ? If you have a dressing, change it as told by your health care provider. ? Leave stitches (sutures), skin staples, skin glue, or adhesive strips in place. These skin closures may need to stay in place for 2 weeks or longer. If adhesive strip edges start to loosen and curl up, you may trim the  loose edges. Do not remove adhesive strips completely unless your health care provider tells you to do that.  Check your incision area every day for signs of infection. Check for: ? More redness, swelling, or pain. ? More fluid or blood. ? Warmth. ? Pus or a bad smell.  When you cough or sneeze, hug a pillow. This helps with pain and decreases the chance of your incision opening up (dehiscing). Do this until your incision heals. Medicines  Take over-the-counter and prescription medicines only as told by your health care provider.  If you were prescribed an antibiotic medicine, take it as told by your health care provider. Do not stop taking the antibiotic until it is finished. Driving  Do not drive or operate heavy machinery while taking prescription pain medicine. Lifestyle  Do not drink alcohol. This is especially important if you are breastfeeding or taking pain medicine.  Do not use tobacco products, including cigarettes, chewing tobacco, or e-cigarettes. If you need help quitting, ask your health care provider. Tobacco can delay wound healing. Eating and drinking  Drink at least 8 eight-ounce glasses of water every day unless told not to by your health care provider. If you breastfeed, you may need to drink more water than this.  Eat high-fiber foods every day. These foods may help prevent or relieve constipation. High-fiber foods include: ? Whole grain cereals and breads. ? Brown rice. ? Beans. ? Fresh fruits and vegetables. Activity  Return to your normal activities as told by your health care provider. Ask your health care provider what activities  are safe for you.  Rest as much as possible. Try to rest or take a nap while your baby is sleeping.  Do not lift anything that is heavier than your baby or 10 lb (4.5 kg) as told by your health care provider.  Ask your health care provider when you can engage in sexual activity. This may depend on your: ? Risk of  infection. ? Healing rate. ? Comfort and desire to engage in sexual activity. Bathing  Do not take baths, swim, or use a hot tub until your health care provider approves. Ask your health care provider if you can take showers. You may only be allowed to take sponge baths until your incision heals. General instructions  Do not use tampons or douches until your health care provider approves.  Wear: ? Loose, comfortable clothing. ? A supportive and well-fitting bra.  Watch for any blood clots that may pass from your vagina. These may look like clumps of dark red, brown, or black discharge.  Keep your perineum clean and dry as told by your health care provider.  Wipe from front to back when you use the toilet.  If possible, have someone help you care for your baby and help with household activities for a few days after you leave the hospital.  Keep all follow-up visits for you and your baby as told by your health care provider. This is important. Contact a health care provider if:  You have: ? Bad-smelling vaginal discharge. ? Difficulty urinating. ? Pain when urinating. ? A sudden increase or decrease in the frequency of your bowel movements. ? More redness, swelling, or pain around your incision. ? More fluid or blood coming from your incision. ? Pus or a bad smell coming from your incision. ? A fever. ? A rash. ? Little or no interest in activities you used to enjoy. ? Questions about caring for yourself or your baby. ? Nausea.  Your incision feels warm to the touch.  Your breasts turn red or become painful or hard.  You feel unusually sad or worried.  You vomit.  You pass large blood clots from your vagina. If you pass a blood clot, save it to show to your health care provider. Do not flush blood clots down the toilet without showing your health care provider.  You urinate more than usual.  You are dizzy or light-headed.  You have not breastfed and have not had a  menstrual period for 12 weeks after delivery.  You stopped breastfeeding and have not had a menstrual period for 12 weeks after stopping breastfeeding. Get help right away if:  You have: ? Pain that does not go away or get better with medicine. ? Chest pain. ? Difficulty breathing. ? Blurred vision or spots in your vision. ? Thoughts about hurting yourself or your baby. ? New pain in your abdomen or in one of your legs. ? A severe headache.  You faint.  You bleed from your vagina so much that you fill two sanitary pads in one hour. This information is not intended to replace advice given to you by your health care provider. Make sure you discuss any questions you have with your health care provider. Document Released: 01/24/2002 Document Revised: 06/06/2016 Document Reviewed: 04/08/2015 Elsevier Interactive Patient Education  [employer].

## 2018-02-06 NOTE — Discharge Summary (Signed)
OB Discharge Summary     Patient Name: Tiffany [name] DOB: April 30, 1981 MRN: [id number]  Date of admission: 02/03/2018 Delivering MD: Georgianne Fick  Date of Delivery: 02/03/2018  Date of discharge: 02/06/2018  Admitting diagnosis: history of previous cesarean deliveries Intrauterine pregnancy: [redacted]w[redacted]d     Secondary diagnosis: None     Discharge diagnosis: Term Pregnancy Delivered, Reasons for cesarean section  Elective repeat                         Hospital course:  Sceduled C/S   37 y.o. yo G3P3003 at [redacted]w[redacted]d was admitted to the hospital 02/03/2018 for scheduled cesarean section with the following indication:Elective Repeat.  Membrane Rupture Time/Date: 12:36 PM ,02/03/2018   Patient delivered a Viable infant.02/03/2018  Details of operation can be found in separate operative note.  Pateint had an uncomplicated postpartum course.  She is ambulating, tolerating a regular diet, passing flatus, and urinating well. Patient is discharged home in stable condition on  02/06/18                                                                        Post partum procedures:none  Complications: None  Physical exam on 02/06/2018: Vitals:   02/05/18 0817 02/05/18 1613 02/05/18 2318 02/06/18 0744  BP: 106/76 102/70 113/77 106/76  Pulse: 85 83 78 75  Resp: 18 18  18   Temp: 98 F (36.7 C) 98.5 F (36.9 C) 98 F (36.7 C) 98.2 F (36.8 C)  TempSrc: Oral Oral Oral Oral  SpO2: 100% 98% 97% 97%  Weight:      Height:       General: alert, cooperative and no distress Lochia: appropriate Uterine Fundus: firm Incision: Healing well with no significant drainage, No significant erythema, Dressing is clean, dry, and intact DVT Evaluation: No evidence of DVT seen on physical exam. Negative Homan's sign.  Labs: Lab Results  Component Value Date   WBC 12.0 (H) 02/04/2018   HGB 9.6 (L) 02/04/2018   HCT 26.4 (L) 02/04/2018   MCV 91.4 02/04/2018   PLT 173 02/04/2018   CMP Latest Ref Rng & Units 09/03/2017   Glucose 65 - 99 mg/dL 90  BUN 6 - 20 mg/dL 12  Creatinine 0.44 - 1.00 mg/dL 0.48  Sodium 135 - 145 mmol/L 135  Potassium 3.5 - 5.1 mmol/L 3.6  Chloride 101 - 111 mmol/L 104  CO2 22 - 32 mmol/L 24  Calcium 8.9 - 10.3 mg/dL 8.4(L)  Total Protein 6.5 - 8.1 g/dL 6.4(L)  Total Bilirubin 0.3 - 1.2 mg/dL 1.1  Alkaline Phos 38 - 126 U/L 51  AST 15 - 41 U/L 19  ALT 14 - 54 U/L 13(L)    Discharge instruction: per After Visit Summary.  Medications:  Allergies as of 02/06/2018      Reactions   Zantac [ranitidine Hcl] Anaphylaxis   Penicillins Rash   Has patient had a PCN reaction causing immediate rash, facial/tongue/throat swelling, SOB or lightheadedness with hypotension: Yes Has patient had a PCN reaction causing severe rash involving mucus membranes or skin necrosis: Yes Has patient had a PCN reaction that required hospitalization No Has patient had a PCN reaction occurring within the last 10 years: No  If all of the above answers are "NO", then may proceed with Cephalosporin use.   Procardia [nifedipine] Rash      Medication List    TAKE these medications   multivitamin-prenatal 27-0.8 MG Tabs tablet Take 1 tablet by mouth daily.   oxyCODONE-acetaminophen 5-325 MG tablet Commonly known as:  PERCOCET/ROXICET Take 1 tablet by mouth every 4 (four) hours as needed (pain scale 4-7).   PRILOSEC OTC 20 MG tablet Generic drug:  omeprazole Take 20 mg by mouth daily.       Diet: routine diet  Activity: Advance as tolerated. Pelvic rest for 6 weeks.   Outpatient follow up: Follow-up Information    Malachy Mood, MD In 1 week.   Specialty:  Obstetrics and Gynecology Why:  For wound re-check Contact information: [address] [place] [address] [phone]             Postpartum contraception: Tubal Ligation Rhogam Given postpartum: no Rubella vaccine given postpartum: no Varicella vaccine given postpartum: no TDaP given antepartum or postpartum:  Yes  Newborn Data: Live born female  Birth Weight: 7 lb 0.5 oz (3190 g) APGAR: 4, 8  Newborn Delivery   Birth date/time:  02/03/2018 12:41:00 Delivery type:  C-Section, Vacuum Assisted Trial of labor:  No C-section categorization:  Repeat      Baby Feeding: Breast  Disposition:home with mother  SIGNED: Hoyt Koch, MD 02/06/2018 11:14 AM

## 2018-02-07 LAB — SURGICAL PATHOLOGY

## 2018-02-09 NOTE — Telephone Encounter (Signed)
Please advise 

## 2018-02-11 ENCOUNTER — Encounter: Payer: Self-pay | Admitting: Obstetrics and Gynecology

## 2018-02-11 ENCOUNTER — Encounter: Payer: 59 | Admitting: Obstetrics and Gynecology

## 2018-02-11 ENCOUNTER — Ambulatory Visit (INDEPENDENT_AMBULATORY_CARE_PROVIDER_SITE_OTHER): Payer: 59 | Admitting: Obstetrics and Gynecology

## 2018-02-11 VITALS — BP 138/78 | HR 81 | Wt 144.0 lb

## 2018-02-11 DIAGNOSIS — Z4889 Encounter for other specified surgical aftercare: Secondary | ICD-10-CM

## 2018-02-14 ENCOUNTER — Other Ambulatory Visit: Payer: 59

## 2018-02-14 NOTE — Progress Notes (Signed)
Postoperative Follow-up Patient presents post op from RLTCS & BTL 1weeks ago for history of prior cesarean section and undesired fertility.  Subjective: Patient reports some improvement in her preop symptoms. Eating a regular diet without difficulty. Pain is controlled without any medications.  Activity: normal activities of daily living.  Objective: Blood pressure 138/78, pulse 81, weight 144 lb (65.3 kg), currently breastfeeding.  Gen: NAD Pulmonary: CTAB HEENT: normocephalic, anicteric Abdomen: soft, non-tender, non-distended, still some lower abdominal wall edema.  Incision D/C/I suture line  Admission on 02/03/2018, Discharged on 02/06/2018  Component Date Value Ref Range Status  . ABO/RH(D) 02/03/2018 O POS   Final  . Antibody Screen 02/03/2018 NEG   Final  . Sample Expiration 02/03/2018    Final                   Value:02/06/2018 Performed at [facility], [address], [place], [place] [address]   . WBC 02/03/2018 9.1  3.6 - 11.0 K/uL Final  . RBC 02/03/2018 3.87  3.80 - 5.20 MIL/uL Final  . Hemoglobin 02/03/2018 12.8  12.0 - 16.0 g/dL Final   RESULT REPEATED AND VERIFIED  . HCT 02/03/2018 35.2  35.0 - 47.0 % Final   RESULT REPEATED AND VERIFIED  . MCV 02/03/2018 90.9  80.0 - 100.0 fL Final  . MCH 02/03/2018 33.1  26.0 - 34.0 pg Final  . MCHC 02/03/2018 36.4* 32.0 - 36.0 g/dL Final  . RDW 02/03/2018 13.5  11.5 - 14.5 % Final  . Platelets 02/03/2018 179  150 - 440 K/uL Final   Performed at [facility], [address] [place], [place] [address]  . RPR Ser Ql 02/03/2018 Non Reactive  Non Reactive Final   Comment: (NOTE) Performed At: [facility] [address] [place], [place] [id number] Rush Farmer MD UX:[id number]   . SURGICAL PATHOLOGY 02/03/2018    Final                   Value:Surgical Pathology CASE: [id number] PATIENT: Tiffany [name] Surgical Pathology Report     SPECIMEN SUBMITTED: A. Fallopian tube  segment, left B. Fallopian tube segment,right  CLINICAL HISTORY: None provided  PRE-OPERATIVE DIAGNOSIS: History of previous cesarean deliveries  POST-OPERATIVE DIAGNOSIS: Same as pre op     DIAGNOSIS: A. FALLOPIAN TUBE SEGMENT, LEFT; TUBAL LIGATION: - FALLOPIAN TUBE WITH FULL CROSS-SECTION OF THE LUMEN EXAMINED.  B.  FALLOPIAN TUBE SEGMENT, RIGHT; TUBAL LIGATION: - FALLOPIAN TUBE WITH FULL CROSS-SECTION OF THE LUMEN EXAMINED.   GROSS DESCRIPTION: A. Labeled: Left fallopian tube segment Received: Formalin Tissue fragment(s): 1 Size: 0.5 cm in length by 0.6 cm in diameter Description: Tan-pink, grossly unremarkable fragment of fallopian tube. Fimbria are absent. Representative cross sections are submitted in cassette 1.  B. Labeled: Right fallopian tube segment Received: Formalin Tissue fragment(s): 1 Size: 1.3 c                         m in length by 0.6 cm in diameter Description: Tan-pink, grossly unremarkable fragment of fallopian tube. Fimbria are absent. Representative cross sections are submitted in cassette 1.     Final Diagnosis performed by Quay Burow, MD.   Electronically signed 02/07/2018 2:29:39PM The electronic signature indicates that the named Attending Pathologist has evaluated the specimen  Technical component performed at [facility], [address] [place], [place] [address] Lab: [phone] Dir: Rush Farmer, MD, MMM  Professional component performed at [facility], [place]  [facility], [place], [place], [place] [address] Lab: [phone] Dir: Dellia Nims. Rubinas, MD   . WBC 02/04/2018 12.0* 3.6 - 11.0 K/uL Final  . RBC 02/04/2018 2.89* 3.80 - 5.20 MIL/uL Final  . Hemoglobin 02/04/2018 9.6* 12.0 - 16.0 g/dL Final   RESULT REPEATED AND VERIFIED  . HCT 02/04/2018 26.4* 35.0 - 47.0 % Final  . MCV 02/04/2018 91.4  80.0 - 100.0 fL Final  . MCH 02/04/2018 33.2  26.0 - 34.0 pg Final  . MCHC 02/04/2018 36.4* 32.0 - 36.0 g/dL Final  .  RDW 02/04/2018 13.1  11.5 - 14.5 % Final  . Platelets 02/04/2018 173  150 - 440 K/uL Final   Performed at [facility], [address] [place], [place] [address]    Assessment: 37 y.o. s/p RLTCS & BTL stable  Plan: Patient has done well after surgery with no apparent complications.  I have discussed the post-operative course to date, and the expected progress moving forward.  The patient understands what complications to be concerned about.  I will see the patient in routine follow up, or sooner if needed.    Activity plan: No heavy lifting.   Tiffany Mood, MD, [facility], [place]

## 2018-02-17 ENCOUNTER — Ambulatory Visit (INDEPENDENT_AMBULATORY_CARE_PROVIDER_SITE_OTHER): Payer: 59 | Admitting: Surgery

## 2018-02-17 ENCOUNTER — Encounter: Payer: Self-pay | Admitting: Surgery

## 2018-02-17 VITALS — BP 113/77 | HR 85 | Temp 98.1°F | Ht 60.0 in | Wt 139.0 lb

## 2018-02-17 DIAGNOSIS — K645 Perianal venous thrombosis: Secondary | ICD-10-CM | POA: Diagnosis not present

## 2018-02-17 MED ORDER — LIDOCAINE 5 % EX OINT: 1.0000 "application " | TOPICAL_OINTMENT | Freq: Three times a day (TID) | CUTANEOUS | 0 refills | Status: DC | PRN

## 2018-02-17 NOTE — Progress Notes (Signed)
Surgical Clinic History and Physical  Referring provider:  Dion Body, MD [facility] [place], [place] [address]  HISTORY OF PRESENT ILLNESS (HPI):  37 y.o. female presents for evaluation of peri-anal pain. Patient reports she is 2 weeks post-partum and first began to experience towards the end of her pregnancy a small posterior hemorrhoid. She was prescribed for this a topical steroid cream by her obstetrician. Following her cesarean section delivery, she required narcotic pain medication for post-surgical pain with subsequent development of rather severe constipation with straining, for which she's been taking colace daily without relief.   3 days ago, patient developed sudden onset of severe peri-anal pain, worse particularly with BM's, for which she's been "religiously" applying the topical steroid cream she was prescribed during pregnancy. Patient states that her hemorrhoid seems slightly smaller, though remains painful with BM's. She otherwise denies any blood per rectum or black BM's, though has continued to pass some vaginal blood since delivery. She otherwise has stopped taking narcotics, though continues to experience constipation, and denies fever/chills, N/V, CP, or SOB.  PAST MEDICAL HISTORY (PMH):  Past Medical History:  Diagnosis Date  . GERD (gastroesophageal reflux disease)    during pregnancy  . PONV (postoperative nausea and vomiting)    nausea with laparoscopy     PAST SURGICAL HISTORY ([place]):  Past Surgical History:  Procedure Laterality Date  . CESAREAN SECTION  02/2014  . CESAREAN SECTION N/A 12/19/2015   Procedure: CESAREAN SECTION;  Surgeon: Malachy Mood, MD;  Location: [facility] ORS;  Service: Obstetrics;  Laterality: N/A;  Time of Birth 08:[birth year] Sex: female Weight: 8 lb 2 oz  . CESAREAN SECTION  02/03/2018  . CESAREAN SECTION WITH BILATERAL TUBAL LIGATION N/A 02/03/2018   Procedure: CESAREAN SECTION WITH BILATERAL TUBAL LIGATION;   Surgeon: Malachy Mood, MD;  Location: [facility] ORS;  Service: Obstetrics;  Laterality: N/A;  . diagnoastic lap  2013  . DILATION AND CURETTAGE OF UTERUS    . endometriosis     history of  . HERNIA REPAIR Right 1984  . INTRAUTERINE DEVICE (IUD) INSERTION  2015  . IUD REMOVAL  2016  . TUBAL LIGATION  02/03/2018     MEDICATIONS:  Prior to Admission medications   Medication Sig Start Date End Date Taking? Authorizing Provider  omeprazole (PRILOSEC OTC) 20 MG tablet Take 20 mg by mouth daily.    Yes [provider]  Prenatal Vit-Fe Fumarate-FA (MULTIVITAMIN-PRENATAL) 27-0.8 MG TABS tablet Take 1 tablet by mouth daily.    Yes [provider]  lidocaine (XYLOCAINE) 5 % ointment Apply 1 application topically 3 (three) times daily as needed. 02/17/18   Vickie Epley, MD     ALLERGIES:  Allergies  Allergen Reactions  . Zantac [Ranitidine Hcl] Anaphylaxis  . Penicillins Rash    Has patient had a PCN reaction causing immediate rash, facial/tongue/throat swelling, SOB or lightheadedness with hypotension: Yes Has patient had a PCN reaction causing severe rash involving mucus membranes or skin necrosis: Yes Has patient had a PCN reaction that required hospitalization No Has patient had a PCN reaction occurring within the last 10 years: No If all of the above answers are "NO", then may proceed with Cephalosporin use.   . Procardia [Nifedipine] Rash     SOCIAL HISTORY:  Social History   Socioeconomic History  . Marital status: Married    Spouse name: Not on file  . Number of children: Not on file  . Years of education: Not on file  .  Highest education level: Not on file  Occupational History  . Occupation: Therapist, sports -OR    Employer: [employer]  . Financial resource strain: Not hard at all  . Food insecurity:    Worry: Never true    Inability: Not on file  . Transportation needs:    Medical: No    Non-medical: No  Tobacco Use  . Smoking status: Never  Smoker  . Smokeless tobacco: Never Used  Substance and Sexual Activity  . Alcohol use: No  . Drug use: No  . Sexual activity: Yes    Birth control/protection: IUD  Lifestyle  . Physical activity:    Days per week: 0 days    Minutes per session: 0 min  . Stress: Not at all  Relationships  . Social connections:    Talks on phone: More than three times a week    Gets together: More than three times a week    Attends religious service: Never    Active member of club or organization: Yes    Attends meetings of clubs or organizations: Never    Relationship status: Married  . Intimate partner violence:    Fear of current or ex partner: Not on file    Emotionally abused: Not on file    Physically abused: Not on file    Forced sexual activity: Not on file  Other Topics Concern  . Not on file  Social History Narrative  . Not on file    The patient currently resides (home / rehab facility / nursing home): Home The patient normally is (ambulatory / bedbound): Ambulatory  FAMILY HISTORY:  Family History  Problem Relation Age of Onset  . Thyroid disease Mother   . Hyperlipidemia Father   . Squamous cell carcinoma Father     Otherwise negative/non-contributory.  REVIEW OF SYSTEMS:  Constitutional: denies any other weight loss, fever, chills, or sweats  Eyes: denies any other vision changes, history of eye injury  ENT: denies sore throat, hearing problems  Respiratory: denies shortness of breath, wheezing  Cardiovascular: denies chest pain, palpitations  Gastrointestinal: abdominal and peri-anal pain, N/V, and bowel function as per HPI Musculoskeletal: denies any other joint pains or cramps  Skin: Denies any other rashes or skin discoloration Neurological: denies any other headache, dizziness, weakness  Psychiatric: Denies any other depression, anxiety   All other review of systems were otherwise negative   VITAL SIGNS:  BP 113/77   Pulse 85   Temp 98.1 F (36.7 C) (Skin)    Ht 5' (1.524 m)   Wt 139 lb (63 kg)   BMI 27.15 kg/m    PHYSICAL EXAM:  Constitutional:  -- Normal body habitus  -- Awake, alert, and oriented x3  Eyes:  -- Pupils equally round and reactive to light  -- No scleral icterus  Ear, nose, throat:  -- No jugular venous distension -- No nasal drainage, bleeding Pulmonary:  -- No crackles  -- Equal breath sounds bilaterally -- Breathing non-labored at rest Cardiovascular:  -- S1, S2 present  -- No pericardial rubs  Gastrointestinal:  -- Abdomen soft, nontender, non-distended, no guarding/rebound  -- No abdominal masses appreciated, pulsatile or otherwise  Anorectal: -- Soft spongy and engorged but reducible and not-currently-thrombosed posterior external vs prolapsed internal solitary appreciable hemorrhoid Musculoskeletal and Integumentary:  -- Wounds or skin discoloration: None appreciated -- Extremities: B/L UE and LE FROM, hands and feet warm, no edema  Neurologic:  -- Motor function: Intact and symmetric --  Sensation: Intact and symmetric  Labs:  CBC Latest Ref Rng & Units 02/04/2018 02/03/2018 11/30/2017  WBC 3.6 - 11.0 K/uL 12.0(H) 9.1 9.8  Hemoglobin 12.0 - 16.0 g/dL 9.6(L) 12.8 12.4  Hematocrit 35.0 - 47.0 % 26.4(L) 35.2 36.8  Platelets 150 - 440 K/uL 173 179 188   CMP Latest Ref Rng & Units 09/03/2017 02/20/2014 02/16/2014  Glucose 65 - 99 mg/dL 90 88 65  BUN 6 - 20 mg/dL 12 12 14   Creatinine 0.44 - 1.00 mg/dL 0.48 0.73 0.78  Sodium 135 - 145 mmol/L 135 141 140  Potassium 3.5 - 5.1 mmol/L 3.6 3.9 3.9  Chloride 101 - 111 mmol/L 104 109(H) 108(H)  CO2 22 - 32 mmol/L 24 25 23   Calcium 8.9 - 10.3 mg/dL 8.4(L) 8.4(L) 8.0(L)  Total Protein 6.5 - 8.1 g/dL 6.4(L) - 5.1(L)  Total Bilirubin 0.3 - 1.2 mg/dL 1.1 - 0.4  Alkaline Phos 38 - 126 U/L 51 - 140(H)  AST 15 - 41 U/L 19 49(H) 20  ALT 14 - 54 U/L 13(L) - 16    Imaging studies: No new pertinent imaging studies available for review   Assessment/Plan:  37 y.o.  healthy and recently post-partum female with what seems likely from patient's history to be recent thrombosis of a tender but now soft and reducible solitary posterior external vs prolapsed internal (grade 3) hemorrhoid.   - adequate hydration, high-fiber heart-healthy diet, and Miralax or mag citrate prn  - topical 5% lidocaine cream prn and topical steroid cream routine (as prescribed, not prn)  - ice may also help reduce hemorrhoidal engorgement/swelling, which sitz baths may offer some relief  - offered routine scheduled follow-up to reassess for improvement, but patient expresses preference to follow-up as needed at this time  - rinstructed to call if any questions or concerns, particularly if worsens/doesn't improve  All of the above recommendations were discussed with the patient and her husband, and all of patient's and family's questions were answered to their expressed satisfaction.  Thank you for the opportunity to participate in this patient's care.  -- Marilynne Drivers Rosana Hoes, MD, [place]: [facility] - Partnering for exceptional care. Office: [phone]

## 2018-02-17 NOTE — Patient Instructions (Addendum)
Please give [place] a call in case you have any questions or concerns.  Hemorrhoids Hemorrhoids are swollen veins in and around the rectum or anus. Hemorrhoids can cause pain, itching, or bleeding. Most of the time, they do not cause serious problems. They usually get better with diet changes, lifestyle changes, and other home treatments. Follow these instructions at home: Eating and drinking  Eat foods that have fiber, such as whole grains, beans, nuts, fruits, and vegetables. Ask your doctor about taking products that have added fiber (fibersupplements).  Drink enough fluid to keep your pee (urine) clear or pale yellow. For Pain and Swelling  Take a warm-water bath (sitz bath) for 20 minutes to ease pain. Do this 3-4 times a day.  If directed, put ice on the painful area. It may be helpful to use ice between your warm baths. ? Put ice in a plastic bag. ? Place a towel between your skin and the bag. ? Leave the ice on for 20 minutes, 2-3 times a day. General instructions  Take over-the-counter and prescription medicines only as told by your doctor. ? Medicated creams and medicines that are inserted into the anus (suppositories) may be used or applied as told.  Exercise often.  Go to the bathroom when you have the urge to poop (to have a bowel movement). Do not wait.  Avoid pushing too hard (straining) when you poop.  Keep the butt area dry and clean. Use wet toilet paper or moist paper towels.  Do not sit on the toilet for a long time. Contact a doctor if:  You have any of these: ? Pain and swelling that do not get better with treatment or medicine. ? Bleeding that will not stop. ? Trouble pooping or you cannot poop. ? Pain or swelling outside the area of the hemorrhoids. This information is not intended to replace advice given to you by your health care provider. Make sure you discuss any questions you have with your health care provider. Document Released: 02/11/2008 Document  Revised: 10/10/2015 Document Reviewed: 01/16/2015 Elsevier Interactive Patient Education  2018 [place].   Polyethylene Glycol powder  (Miralax) What is this medicine? POLYETHYLENE GLYCOL 3350 (pol ee ETH i leen; GLYE col) powder is a laxative used to treat constipation. It increases the amount of water in the stool. Bowel movements become easier and more frequent. This medicine may be used for other purposes; ask your health care provider or pharmacist if you have questions. COMMON BRAND NAME(S): Sharlyn Bologna, GlycoLax, MiraLax, Smooth LAX, V[employer] Health What should I tell my health care provider before I take this medicine? They need to know if you have any of these conditions: -a history of blockage of the stomach or intestine -current abdomen distension or pain -difficulty swallowing -diverticulitis, ulcerative colitis, or other chronic bowel disease -phenylketonuria -an unusual or allergic reaction to polyethylene glycol, other medicines, dyes, or preservatives -pregnant or trying to get pregnant -breast-feeding How should I use this medicine? Take this medicine by mouth. The bottle has a measuring cap that is marked with a line. Pour the powder into the cap up to the marked line (the dose is about 1 heaping tablespoon). Add the powder in the cap to a full glass (4 to 8 ounces or 120 to 240 ml) of water, juice, soda, coffee or tea. Mix the powder well. Drink the solution. Take exactly as directed. Do not take your medicine more often than directed. Talk to your pediatrician regarding the use of this  medicine in children. Special care may be needed. Overdosage: If you think you have taken too much of this medicine contact a poison control center or emergency room at once. NOTE: This medicine is only for you. Do not share this medicine with others. What if I miss a dose? If you miss a dose, take it as soon as you can. If it is almost time for your next dose, take only that dose. Do  not take double or extra doses. What may interact with this medicine? Interactions are not expected. This list may not describe all possible interactions. Give your health care provider a list of all the medicines, herbs, non-prescription drugs, or dietary supplements you use. Also tell them if you smoke, drink alcohol, or use illegal drugs. Some items may interact with your medicine. What should I watch for while using this medicine? Do not use for more than 2 weeks without advice from your doctor or health care professional. It can take 2 to 4 days to have a bowel movement and to experience improvement in constipation. See your health care professional for any changes in bowel habits, including constipation, that are severe or last longer than three weeks. Always take this medicine with plenty of water. What side effects may I notice from receiving this medicine? Side effects that you should report to your doctor or health care professional as soon as possible: -diarrhea -difficulty breathing -itching of the skin, hives, or skin rash -severe bloating, pain, or distension of the stomach -vomiting Side effects that usually do not require medical attention (report to your doctor or health care professional if they continue or are bothersome): -bloating or gas -lower abdominal discomfort or cramps -nausea This list may not describe all possible side effects. Call your doctor for medical advice about side effects. You may report side effects to FDA at 1-800-FDA-1088. Where should I keep my medicine? Keep out of the reach of children. Store between 15 and 30 degrees C (59 and 86 degrees F). Throw away any unused medicine after the expiration date. NOTE: This sheet is a summary. It may not cover all possible information. If you have questions about this medicine, talk to your doctor, pharmacist, or health care provider.  2018 Elsevier/Gold Standard (2007-12-05 16:50:45)

## 2018-02-18 ENCOUNTER — Encounter: Payer: Self-pay | Admitting: Surgery

## 2018-02-20 ENCOUNTER — Emergency Department: Payer: 59 | Admitting: Anesthesiology

## 2018-02-20 ENCOUNTER — Encounter
Admission: EM | Disposition: A | Discharge status: Home / Self Care | Payer: Self-pay | Source: Home / Self Care | Attending: Emergency Medicine

## 2018-02-20 ENCOUNTER — Emergency Department
Admission: EM | Admit: 2018-02-20 | Discharge: 2018-02-20 | Disposition: A | Discharge status: Home / Self Care | Payer: 59 | Attending: Emergency Medicine | Admitting: Emergency Medicine

## 2018-02-20 ENCOUNTER — Other Ambulatory Visit: Payer: Self-pay

## 2018-02-20 ENCOUNTER — Encounter: Payer: Self-pay | Admitting: Anesthesiology

## 2018-02-20 DIAGNOSIS — K648 Other hemorrhoids: Secondary | ICD-10-CM | POA: Insufficient documentation

## 2018-02-20 DIAGNOSIS — K644 Residual hemorrhoidal skin tags: Secondary | ICD-10-CM | POA: Diagnosis not present

## 2018-02-20 DIAGNOSIS — K219 Gastro-esophageal reflux disease without esophagitis: Secondary | ICD-10-CM | POA: Diagnosis not present

## 2018-02-20 DIAGNOSIS — Z79899 Other long term (current) drug therapy: Secondary | ICD-10-CM | POA: Insufficient documentation

## 2018-02-20 DIAGNOSIS — K649 Unspecified hemorrhoids: Secondary | ICD-10-CM | POA: Diagnosis not present

## 2018-02-20 HISTORY — PX: EVALUATION UNDER ANESTHESIA WITH HEMORRHOIDECTOMY: SHX5624

## 2018-02-20 SURGERY — EXAM UNDER ANESTHESIA WITH HEMORRHOIDECTOMY
Anesthesia: General

## 2018-02-20 MED ORDER — SUGAMMADEX SODIUM 200 MG/2ML IV SOLN
INTRAVENOUS | Status: DC | PRN
  Administered 2018-02-20: 130 mg via INTRAVENOUS

## 2018-02-20 MED ORDER — PROPOFOL 500 MG/50ML IV EMUL
INTRAVENOUS | Status: AC
  Filled 2018-02-20: qty 50

## 2018-02-20 MED ORDER — BUPIVACAINE LIPOSOME 1.3 % IJ SUSP
INTRAMUSCULAR | Status: AC
  Filled 2018-02-20: qty 20

## 2018-02-20 MED ORDER — SUCCINYLCHOLINE CHLORIDE 20 MG/ML IJ SOLN
INTRAMUSCULAR | Status: DC | PRN
  Administered 2018-02-20: 100 mg via INTRAVENOUS

## 2018-02-20 MED ORDER — ACETAMINOPHEN 10 MG/ML IV SOLN
INTRAVENOUS | Status: DC | PRN
  Administered 2018-02-20: 1000 mg via INTRAVENOUS

## 2018-02-20 MED ORDER — PROPOFOL 10 MG/ML IV BOLUS
INTRAVENOUS | Status: DC | PRN
  Administered 2018-02-20: 130 mg via INTRAVENOUS

## 2018-02-20 MED ORDER — LIDOCAINE HCL (PF) 2 % IJ SOLN
INTRAMUSCULAR | Status: AC
  Filled 2018-02-20: qty 10

## 2018-02-20 MED ORDER — CHLORHEXIDINE GLUCONATE CLOTH 2 % EX PADS
6.0000 | MEDICATED_PAD | Freq: Once | CUTANEOUS | Status: DC
  Filled 2018-02-20: qty 6

## 2018-02-20 MED ORDER — SODIUM CHLORIDE FLUSH 0.9 % IV SOLN
INTRAVENOUS | Status: AC
  Filled 2018-02-20: qty 40

## 2018-02-20 MED ORDER — ONDANSETRON HCL 4 MG/2ML IJ SOLN
INTRAMUSCULAR | Status: AC
  Filled 2018-02-20: qty 2

## 2018-02-20 MED ORDER — CIPROFLOXACIN IN D5W 400 MG/200ML IV SOLN
INTRAVENOUS | Status: AC
  Filled 2018-02-20: qty 200

## 2018-02-20 MED ORDER — ROCURONIUM BROMIDE 50 MG/5ML IV SOLN
INTRAVENOUS | Status: AC
  Filled 2018-02-20: qty 1

## 2018-02-20 MED ORDER — PROMETHAZINE HCL 25 MG/ML IJ SOLN: 6.2500 mg | INTRAMUSCULAR | Status: DC | PRN

## 2018-02-20 MED ORDER — SUGAMMADEX SODIUM 200 MG/2ML IV SOLN
INTRAVENOUS | Status: AC
  Filled 2018-02-20: qty 2

## 2018-02-20 MED ORDER — METRONIDAZOLE IN NACL 5-0.79 MG/ML-% IV SOLN
INTRAVENOUS | Status: AC
  Filled 2018-02-20: qty 100

## 2018-02-20 MED ORDER — MIDAZOLAM HCL 2 MG/2ML IJ SOLN
INTRAMUSCULAR | Status: DC | PRN
  Administered 2018-02-20: 2 mg via INTRAVENOUS

## 2018-02-20 MED ORDER — BUPIVACAINE-EPINEPHRINE (PF) 0.25% -1:200000 IJ SOLN
INTRAMUSCULAR | Status: AC
  Filled 2018-02-20: qty 30

## 2018-02-20 MED ORDER — METRONIDAZOLE IN NACL 5-0.79 MG/ML-% IV SOLN
500.0000 mg | INTRAVENOUS | Status: AC
  Administered 2018-02-20: 500 mg via INTRAVENOUS

## 2018-02-20 MED ORDER — ONDANSETRON HCL 4 MG/2ML IJ SOLN
INTRAMUSCULAR | Status: DC | PRN
  Administered 2018-02-20: 4 mg via INTRAVENOUS

## 2018-02-20 MED ORDER — SUCCINYLCHOLINE CHLORIDE 20 MG/ML IJ SOLN
INTRAMUSCULAR | Status: AC
  Filled 2018-02-20: qty 1

## 2018-02-20 MED ORDER — FENTANYL CITRATE (PF) 100 MCG/2ML IJ SOLN: 25.0000 ug | INTRAMUSCULAR | Status: DC | PRN

## 2018-02-20 MED ORDER — DEXAMETHASONE SODIUM PHOSPHATE 10 MG/ML IJ SOLN
INTRAMUSCULAR | Status: DC | PRN
  Administered 2018-02-20: 10 mg via INTRAVENOUS

## 2018-02-20 MED ORDER — CIPROFLOXACIN IN D5W 400 MG/200ML IV SOLN
400.0000 mg | INTRAVENOUS | Status: AC
  Administered 2018-02-20: 400 mg via INTRAVENOUS

## 2018-02-20 MED ORDER — LIDOCAINE HCL (CARDIAC) PF 100 MG/5ML IV SOSY
PREFILLED_SYRINGE | INTRAVENOUS | Status: DC | PRN
  Administered 2018-02-20: 100 mg via INTRAVENOUS

## 2018-02-20 MED ORDER — HYDROCODONE-ACETAMINOPHEN 5-325 MG PO TABS: 1.0000 | ORAL_TABLET | Freq: Four times a day (QID) | ORAL | 0 refills | Status: DC | PRN

## 2018-02-20 MED ORDER — KETOROLAC TROMETHAMINE 30 MG/ML IJ SOLN
INTRAMUSCULAR | Status: AC
  Filled 2018-02-20: qty 1

## 2018-02-20 MED ORDER — FENTANYL CITRATE (PF) 100 MCG/2ML IJ SOLN
INTRAMUSCULAR | Status: AC
  Filled 2018-02-20: qty 2

## 2018-02-20 MED ORDER — BUPIVACAINE LIPOSOME 1.3 % IJ SUSP
INTRAMUSCULAR | Status: DC | PRN
  Administered 2018-02-20: 50 mL

## 2018-02-20 MED ORDER — FENTANYL CITRATE (PF) 100 MCG/2ML IJ SOLN
INTRAMUSCULAR | Status: DC | PRN
  Administered 2018-02-20 (×2): 50 ug via INTRAVENOUS

## 2018-02-20 MED ORDER — KETOROLAC TROMETHAMINE 30 MG/ML IJ SOLN
INTRAMUSCULAR | Status: DC | PRN
  Administered 2018-02-20: 30 mg via INTRAVENOUS

## 2018-02-20 MED ORDER — PROPOFOL 500 MG/50ML IV EMUL
INTRAVENOUS | Status: DC | PRN
  Administered 2018-02-20: 200 ug/kg/min via INTRAVENOUS

## 2018-02-20 MED ORDER — DEXAMETHASONE SODIUM PHOSPHATE 10 MG/ML IJ SOLN
INTRAMUSCULAR | Status: AC
  Filled 2018-02-20: qty 1

## 2018-02-20 MED ORDER — ACETAMINOPHEN 10 MG/ML IV SOLN
INTRAVENOUS | Status: AC
  Filled 2018-02-20: qty 100

## 2018-02-20 MED ORDER — ROCURONIUM BROMIDE 100 MG/10ML IV SOLN
INTRAVENOUS | Status: DC | PRN
  Administered 2018-02-20: 20 mg via INTRAVENOUS

## 2018-02-20 MED ORDER — LACTATED RINGERS IV SOLN
INTRAVENOUS | Status: DC | PRN
  Administered 2018-02-20: 14:00:00 via INTRAVENOUS

## 2018-02-20 SURGICAL SUPPLY — 15 items
BLADE CLIPPER SURG (BLADE) ×3 IMPLANT
CANISTER SUCT 1200ML W/VALVE (MISCELLANEOUS) ×3 IMPLANT
DRAPE LAPAROTOMY 100X77 ABD (DRAPES) IMPLANT
DRAPE LEGGINS SURG 28X43 STRL (DRAPES) IMPLANT
DRAPE UNDER BUTTOCK W/FLU (DRAPES) IMPLANT
ELECT REM PT RETURN 9FT ADLT (ELECTROSURGICAL) ×3
ELECTRODE REM PT RTRN 9FT ADLT (ELECTROSURGICAL) ×1 IMPLANT
GLOVE BIO SURGEON STRL SZ7 (GLOVE) ×3 IMPLANT
GOWN STRL REUS W/ TWL LRG LVL3 (GOWN DISPOSABLE) ×2 IMPLANT
GOWN STRL REUS W/TWL LRG LVL3 (GOWN DISPOSABLE) ×4
PACK BASIN MINOR ARMC (MISCELLANEOUS) ×3 IMPLANT
PAD PREP 24X41 OB/GYN DISP (PERSONAL CARE ITEMS) ×3 IMPLANT
SOL PREP PVP 2OZ (MISCELLANEOUS) ×3
SOLUTION PREP PVP 2OZ (MISCELLANEOUS) ×1 IMPLANT
SPONGE LAP 18X18 RF (DISPOSABLE) ×3 IMPLANT

## 2018-02-20 NOTE — Transfer of Care (Signed)
Immediate Anesthesia Transfer of Care Note  Patient: [name]  Procedure(s) Performed: EXAM UNDER ANESTHESIA WITH HEMORRHOIDECTOMY (N/A )  Patient Location: PACU  Anesthesia Type:General  Level of Consciousness: awake, alert  and oriented  Airway & Oxygen Therapy: Patient connected to face mask oxygen  Post-op Assessment: Post -op Vital signs reviewed and stable  Post vital signs: stable  Last Vitals:  Vitals Value Taken Time  BP 114/80 02/20/2018  2:58 PM  Temp 36.7 C 02/20/2018  2:58 PM  Pulse 107 02/20/2018  3:00 PM  Resp 15 02/20/2018  3:00 PM  SpO2 100 % 02/20/2018  3:00 PM  Vitals shown include unvalidated device data.  Last Pain:  Vitals:   02/20/18 1458  TempSrc:   PainSc: (P) 0-No pain         Complications: No apparent anesthesia complications

## 2018-02-20 NOTE — Op Note (Signed)
  02/20/2018  2:44 PM  PATIENT:  Tiffany [name]  37 y.o. female  PRE-OPERATIVE DIAGNOSIS:  strangulated hemorrhoids  POST-OPERATIVE DIAGNOSIS:  Same  PROCEDURE:   Excision of strangulated hemorrhoids x 2  SURGEON:  Surgeon(s) and Role:    * Samaria Anes F, MD - [facility] ANESTHESIA: GETA  FINDINGS: strangulated internal/external hemorrhoids right and left posterolateral No rectal masses or any evidence of perianal sepsis  DICTATION:  Patient was explained about the procedure in detail, risk benefits possible complications and a consent was obtained. The patient taken to the operating room and placed in the lithotomy position.  Exam under anesthesia revealed that the right posterior lateral strangulated internal and external hemorrhoids.  [name] was placed to elevate the cushions and using the harmonic scalpel were able to excise both of the hemorrhoidal cushions.  There was evidence of hemostasis.  Liposomal Marcaine + Marcaine quarter percent with epinephrine was injected around the wound site. Needle and laparotomy counts were correct and there were no immediate complications  Jules Husbands, MD

## 2018-02-20 NOTE — ED Triage Notes (Signed)
Pt c/o hemorrhoids since Tuesday.

## 2018-02-20 NOTE — Progress Notes (Signed)
Ginger ale given.  No acute distress.

## 2018-02-20 NOTE — Anesthesia Procedure Notes (Signed)
Procedure Name: Intubation Date/Time: 02/20/2018 2:13 PM Performed by: Aline Brochure, CRNA Pre-anesthesia Checklist: Patient identified, Emergency Drugs available, Suction available and Patient being monitored Patient Re-evaluated:Patient Re-evaluated prior to induction Oxygen Delivery Method: Circle system utilized Preoxygenation: Pre-oxygenation with 100% oxygen Induction Type: IV induction, Rapid sequence and Cricoid Pressure applied Laryngoscope Size: Mac and 3 Grade View: Grade II Tube type: Oral Tube size: 7.0 mm Number of attempts: 1 Airway Equipment and Method: Stylet Placement Confirmation: ETT inserted through vocal cords under direct vision,  positive ETCO2 and breath sounds checked- equal and bilateral Secured at: 22 cm Tube secured with: Tape Dental Injury: Teeth and Oropharynx as per pre-operative assessment

## 2018-02-20 NOTE — Anesthesia Post-op Follow-up Note (Signed)
Anesthesia QCDR form completed.        

## 2018-02-20 NOTE — H&P (Addendum)
Patient ID: [name], female   DOB: August 15, 1980, 37 y.o.   MRN: [id number]  HPI Tiffany [name] is a 37 y.o. female seen in the emergency room for severe anorectal pain.  She reports that she has had issues with hemorrhoids since pregnancy.  She was seen 3 days ago by my partner and since then she has been doing the steroid cream lidocaine cream and sitz baths.  Comes in because of worsening of the symptoms for the last 24 hours.  She reports severe anorectal pain that is sharp and she feels the bulging hemorrhoids.  No fevers no chills no other constitutional symptoms.  No nausea no vomiting no weight loss.  She is 2-1/2 weeks after a C-section that was uncomplicated.  Pain is worsened when she sits.  She has to lay on her side all the time.  She is able to perform more than 4 METS of activity without any shortness or other chest pain. Pt discussed w Dr. Alfred Levins in detail.   HPI  Past Medical History:  Diagnosis Date  . GERD (gastroesophageal reflux disease)    during pregnancy  . PONV (postoperative nausea and vomiting)    nausea with laparoscopy    Past Surgical History:  Procedure Laterality Date  . CESAREAN SECTION  02/2014  . CESAREAN SECTION N/A 12/19/2015   Procedure: CESAREAN SECTION;  Surgeon: Malachy Mood, MD;  Location: [facility] ORS;  Service: Obstetrics;  Laterality: N/A;  Time of Birth 08:24 Sex: female Weight: 8 lb 2 oz  . CESAREAN SECTION  02/03/2018  . CESAREAN SECTION WITH BILATERAL TUBAL LIGATION N/A 02/03/2018   Procedure: CESAREAN SECTION WITH BILATERAL TUBAL LIGATION;  Surgeon: Malachy Mood, MD;  Location: [facility] ORS;  Service: Obstetrics;  Laterality: N/A;  . diagnoastic lap  2013  . DILATION AND CURETTAGE OF UTERUS    . endometriosis     history of  . HERNIA REPAIR Right 1984  . INTRAUTERINE DEVICE (IUD) INSERTION  2015  . IUD REMOVAL  2016  . TUBAL LIGATION  02/03/2018    Family History  Problem Relation Age of Onset  . Thyroid disease Mother    . Hyperlipidemia Father   . Squamous cell carcinoma Father     Social History Social History   Tobacco Use  . Smoking status: Never Smoker  . Smokeless tobacco: Never Used  Substance Use Topics  . Alcohol use: No  . Drug use: No    Allergies  Allergen Reactions  . Zantac [Ranitidine Hcl] Anaphylaxis  . Penicillins Rash    Has patient had a PCN reaction causing immediate rash, facial/tongue/throat swelling, SOB or lightheadedness with hypotension: Yes Has patient had a PCN reaction causing severe rash involving mucus membranes or skin necrosis: Yes Has patient had a PCN reaction that required hospitalization No Has patient had a PCN reaction occurring within the last 10 years: No If all of the above answers are "NO", then may proceed with Cephalosporin use.   . Procardia [Nifedipine] Rash    No current facility-administered medications for this encounter.    Current Outpatient Medications  Medication Sig Dispense Refill  . lidocaine (XYLOCAINE) 5 % ointment Apply 1 application topically 3 (three) times daily as needed. 35.44 g 0  . omeprazole (PRILOSEC OTC) 20 MG tablet Take 20 mg by mouth daily.     . Prenatal Vit-Fe Fumarate-FA (MULTIVITAMIN-PRENATAL) 27-0.8 MG TABS tablet Take 1 tablet by mouth daily.        Review of Systems Full ROS  was asked and was negative except for the information on the HPI  Physical Exam currently breastfeeding. CONSTITUTIONAL: NAD EYES: Pupils are equal, round, and reactive to light, Sclera are non-icteric. EARS, NOSE, MOUTH AND THROAT: The oropharynx is clear. The oral mucosa is pink and moist. Hearing is intact to voice. LYMPH NODES:  Lymph nodes in the neck are normal. RESPIRATORY:  Lungs are clear. There is normal respiratory effort, with equal breath sounds bilaterally, and without pathologic use of accessory muscles. CARDIOVASCULAR: Heart is regular without murmurs, gallops, or rubs. GI: The abdomen is  soft, nontender, and  nondistended. There are no palpable masses. There is no hepatosplenomegaly. There are normal bowel sounds in all quadrants. Rectal:  Incarcerated internal hemmorhoids, left and right posterolateral. Exquisitely tender to palpation. No rectal masses MUSCULOSKELETAL: Normal muscle strength and tone. No cyanosis or edema.   SKIN: Turgor is good and there are no pathologic skin lesions or ulcers. NEUROLOGIC: Motor and sensation is grossly normal. Cranial nerves are grossly intact. PSYCH:  Oriented to person, place and time. Affect is normal.  Data Reviewed  I have personally reviewed the patient's imaging, laboratory findings and medical records.    Assessment./Plan   37 year old female with incarcerated internal hemorrhoids.  Discussed with the patient in detail about her disease process and I do recommend exam under anesthesia and excision in the OR.  Procedure discussed with the patient detail.  Risk benefit and possible complications including but not limited to: Bleeding, infection, recurrence, anal stenosis.  Chronic pain.  She understands and wishes to proceed.   Caroleen Hamman, MD FACS General Surgeon 02/20/2018, 11:55 AM

## 2018-02-20 NOTE — ED Provider Notes (Signed)
[facility] Emergency Department Provider Note  ____________________________________________  Time seen: Approximately 12:15 PM  I have reviewed the triage vital signs and the nursing notes.   HISTORY  Chief Complaint Hemorrhoids   HPI Tiffany [name] is a 37 y.o. female no significant past medical history who presents for evaluation of rectal pain.  Patient reports that she recently had a baby via C-section and was put on Percocets.  She then became very constipated and after having very hard bowel movement she developed hemorrhoids.  She was seen by Dr. Rosana Hoes in the office 3 days ago.  She continues to have pain in her symptoms became worse today.  She is complaining of severe pain that is present when she sits now, tries to have a bowel movement, or stands up.  The pain is better if she turns on her side.  The pain is constant located in her rectum and nonradiating.  No abdominal pain, vomiting, or nausea.   Past Medical History:  Diagnosis Date  . GERD (gastroesophageal reflux disease)    during pregnancy  . PONV (postoperative nausea and vomiting)    nausea with laparoscopy    Patient Active Problem List   Diagnosis Date Noted  . Internal and external strangulated hemorrhoids   . Single liveborn, born in hospital, delivered by cesarean delivery 02/06/2018  . Polyhydramnios 01/21/2018  . 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 History of 2 cesarean sections 08/27/2017    Past Surgical History:  Procedure Laterality Date  . CESAREAN SECTION  02/2014  . CESAREAN SECTION N/A 12/19/2015   Procedure: CESAREAN SECTION;  Surgeon: Malachy Mood, MD;  Location: [facility] ORS;  Service: Obstetrics;  Laterality: N/A;  Time of Birth 08:24 Sex: female Weight: 8 lb 2 oz  . CESAREAN SECTION  02/03/2018  . CESAREAN SECTION WITH BILATERAL TUBAL LIGATION N/A 02/03/2018   Procedure: CESAREAN SECTION WITH BILATERAL TUBAL LIGATION;  Surgeon: Malachy Mood, MD;   Location: [facility] ORS;  Service: Obstetrics;  Laterality: N/A;  . diagnoastic lap  2013  . DILATION AND CURETTAGE OF UTERUS    . endometriosis     history of  . HERNIA REPAIR Right 1984  . INTRAUTERINE DEVICE (IUD) INSERTION  2015  . IUD REMOVAL  2016  . TUBAL LIGATION  02/03/2018    Prior to Admission medications   Medication Sig Start Date End Date Taking? Authorizing Provider  lidocaine (XYLOCAINE) 5 % ointment Apply 1 application topically 3 (three) times daily as needed. 02/17/18   Vickie Epley, MD  omeprazole (PRILOSEC OTC) 20 MG tablet Take 20 mg by mouth daily.     [provider]  Prenatal Vit-Fe Fumarate-FA (MULTIVITAMIN-PRENATAL) 27-0.8 MG TABS tablet Take 1 tablet by mouth daily.     [provider]    Allergies Zantac [ranitidine hcl]; Penicillins; and Procardia [nifedipine]  Family History  Problem Relation Age of Onset  . Thyroid disease Mother   . Hyperlipidemia Father   . Squamous cell carcinoma Father     Social History Social History   Tobacco Use  . Smoking status: Never Smoker  . Smokeless tobacco: Never Used  Substance Use Topics  . Alcohol use: No  . Drug use: No    Review of Systems  Constitutional: Negative for fever. Eyes: Negative for visual changes. ENT: Negative for sore throat. Neck: No neck pain  Cardiovascular: Negative for chest pain. Respiratory: Negative for shortness of breath. Gastrointestinal: Negative for abdominal pain, vomiting or diarrhea. Genitourinary: Negative  for dysuria. + hemorrhoid Musculoskeletal: Negative for back pain. Skin: Negative for rash. Neurological: Negative for headaches, weakness or numbness. Psych: No SI or HI  ____________________________________________   PHYSICAL EXAM:  VITAL SIGNS: ED Triage Vitals  Enc Vitals Group     BP 02/20/18 1158 118/80     Pulse Rate 02/20/18 1158 60     Resp 02/20/18 1158 16     Temp 02/20/18 1158 97.9 F (36.6 C)     Temp Source 02/20/18  1158 Oral     SpO2 02/20/18 1158 98 %     Weight 02/20/18 1159 138 lb (62.6 kg)     Height 02/20/18 1159 5' (1.524 m)     Head Circumference --      Peak Flow --      Pain Score 02/20/18 1159 6     Pain Loc --      Pain Edu? --      Excl. in [place]? --     Constitutional: Alert and oriented. Well appearing and in no apparent distress. HEENT:      Head: Normocephalic and atraumatic.         Eyes: Conjunctivae are normal. Sclera is non-icteric.       Mouth/Throat: Mucous membranes are moist.       Neck: Supple with no signs of meningismus. Cardiovascular: Regular rate and rhythm. No murmurs, gallops, or rubs. 2+ symmetrical distal pulses are present in all extremities. No JVD. Respiratory: Normal respiratory effort. Lungs are clear to auscultation bilaterally. No wheezes, crackles, or rhonchi.  Gastrointestinal: Soft, non tender, and non distended with positive bowel sounds. No rebound or guarding. Rectal: deferred Musculoskeletal: Nontender with normal range of motion in all extremities. No edema, cyanosis, or erythema of extremities. Neurologic: Normal speech and language. Face is symmetric. Moving all extremities. No gross focal neurologic deficits are appreciated. Skin: Skin is warm, dry and intact. No rash noted. Psychiatric: Mood and affect are normal. Speech and behavior are normal.  ____________________________________________   LABS (all labs ordered are listed, but only abnormal results are displayed)  Labs Reviewed - No data to display ____________________________________________  EKG  none  ____________________________________________  RADIOLOGY  none  ____________________________________________   PROCEDURES  Procedure(s) performed: None Procedures Critical Care performed:  None ____________________________________________   INITIAL IMPRESSION / ASSESSMENT AND PLAN / ED COURSE  37 y.o. female no significant past medical history who presents for  evaluation of rectal pain.  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 She was evaluated by him prior to my evaluation and diagnosed with strangulated internal and external hemorrhoid. Since patient had already been examined by Dr. Dahlia Byes, repeat rectal exam was deferred. Patient will be taken to the OR for surgical repair.       As part of my medical decision making, I reviewed the following data within the [place] notes reviewed and incorporated, Old chart reviewed, A consult was requested and obtained from this/these consultant(s) Surgery, Notes from prior ED visits and [place] Controlled Substance Database    Pertinent labs & imaging results that were available during my care of the patient were reviewed by me and considered in my medical decision making (see chart for details).    ____________________________________________   FINAL CLINICAL IMPRESSION(S) / ED DIAGNOSES  Final diagnoses:  Internal and external strangulated hemorrhoids      NEW MEDICATIONS STARTED DURING THIS VISIT:  ED Discharge Orders    None  Note:  This document was prepared using Dragon voice recognition software and may include unintentional dictation errors.    Rudene Re, MD 02/20/18 [phone]

## 2018-02-20 NOTE — Anesthesia Postprocedure Evaluation (Signed)
Anesthesia Post Note  Patient: Tiffany [name]  Procedure(s) Performed: EXAM UNDER ANESTHESIA WITH HEMORRHOIDECTOMY (N/A )  Patient location during evaluation: PACU Anesthesia Type: General Level of consciousness: awake and alert Pain management: pain level controlled Vital Signs Assessment: post-procedure vital signs reviewed and stable Respiratory status: spontaneous breathing, nonlabored ventilation, respiratory function stable and patient connected to nasal cannula oxygen Cardiovascular status: blood pressure returned to baseline and stable Postop Assessment: no apparent nausea or vomiting Anesthetic complications: no     Last Vitals:  Vitals:   02/20/18 1543 02/20/18 1600  BP: 120/85 118/74  Pulse:    Resp:    Temp:    SpO2: 100% 100%    Last Pain:  Vitals:   02/20/18 1600  TempSrc:   PainSc: 0-No pain                 Martha Clan

## 2018-02-20 NOTE — Discharge Instructions (Signed)
Surgical Procedures for Hemorrhoids Surgical procedures can be used to treat hemorrhoids. Hemorrhoids are swollen veins that are inside the rectum (internal hemorrhoids) or around the anus (external hemorrhoids). They are caused by increased pressure in the anal area. This pressure may result from straining to have a bowel movement (constipation), diarrhea, pregnancy, obesity, anal sex, or sitting for long periods of time. Hemorrhoids can cause symptoms such as pain and bleeding. Surgery may be needed if diet changes, lifestyle changes, and other treatments do not help your symptoms. Various surgical methods may be used. Three common methods are:  Closed hemorrhoidectomy. The hemorrhoids are surgically removed, and the surgical cuts (incisions) are closed with stitches (sutures).  Open hemorrhoidectomy. The hemorrhoids are surgically removed, but the incisions are allowed to heal without sutures.  Stapled hemorrhoidopexy. The hemorrhoids are removed using a device that takes out a ring of excess tissue.  Tell a health care provider about:  Any allergies you have.  All medicines you are taking, including vitamins, herbs, eye drops, creams, and over-the-counter medicines.  Any problems you or family members have had with anesthetic medicines.  Any blood disorders you have.  Any surgeries you have had.  Any medical conditions you have.  Whether you are pregnant or may be pregnant. What are the risks? Generally, this is a safe procedure. However, problems may occur, including:  Infection.  Bleeding.  Allergic reactions to medicines.  Damage to other structures or organs.  Pain.  Constipation.  Difficulty passing urine.  Narrowing of the anal canal (stenosis).  Difficulty controlling bowel movements (incontinence).  What happens before the procedure?  Ask your health care provider about: ? Changing or stopping your regular medicines. This is especially important if you are  taking diabetes medicines or blood thinners. ? Taking medicines such as aspirin and ibuprofen. These medicines can thin your blood. Do not take these medicines before your procedure if your health care provider instructs you not to.  You may need to have a procedure to examine the inside of your colon with a scope (colonoscopy). Your health care provider may do this to make sure that there are no other causes for your bleeding or pain.  Follow instructions from your health care provider about eating or drinking restrictions.  You may be instructed to take a laxative and an enema to clean out your colon before surgery (bowel prep). Carefully follow instructions from your health care provider about bowel prep.  Ask your health care provider how your surgical site will be marked or identified.  You may be given antibiotic medicine to help prevent infection.  Plan to have someone take you home after the procedure. What happens during the procedure?  To reduce your risk of infection: ? Your health care team will wash or sanitize their hands. ? Your skin will be washed with soap.  An IV tube will be inserted into one of your veins.  You will be given one or more of the following: ? A medicine to help you relax (sedative). ? A medicine to numb the area (local anesthetic). ? A medicine to make you fall asleep (general anesthetic). ? A medicine that is injected into an area of your body to numb everything below the injection site (regional anesthetic).  A lubricating jelly may be placed into your rectum.  Your surgeon will insert a short scope (anoscope) into your rectum to examine the hemorrhoids.  One of the following hemorrhoid procedures will be performed. Closed Hemorrhoidectomy  Your surgeon   will use surgical instruments to open the tissue around the hemorrhoids.  The veins that supply the hemorrhoids will be tied off with a suture.  The hemorrhoids will be removed.  The tissue  that surrounds the hemorrhoids will be closed with sutures that your body can absorb (absorbable sutures). Open Hemorrhoidectomy  The hemorrhoids will be removed with surgical instruments.  The incisions will be left open to heal without sutures. Stapled Hemorrhoidopexy  Your surgeon will use a circular stapling device to remove the hemorrhoids.  The device will be inserted into your anus. It will remove a circular ring of tissue that includes hemorrhoid tissue and some tissue above the hemorrhoids.  The staples in the device will close the edges of removed tissue. This will cut off the blood supply to the hemorrhoids and will pull any remaining hemorrhoids back into place. Each of these procedures may vary among health care providers and hospitals. What happens after the procedure?  Your blood pressure, heart rate, breathing rate, and blood oxygen level will be monitored often until the medicines you were given have worn off.  You will be given pain medicine as needed. This information is not intended to replace advice given to you by your health care provider. Make sure you discuss any questions you have with your health care provider. Document Released: 03/01/2009 Document Revised: 10/10/2015 Document Reviewed: 07/30/2014 Elsevier Interactive Patient Education  2018 Elsevier Inc.  

## 2018-02-20 NOTE — Anesthesia Preprocedure Evaluation (Addendum)
Anesthesia Evaluation  Patient identified by MRN, date of birth, ID band Patient awake    Reviewed: Allergy & Precautions, NPO status , Patient's Chart, lab work & pertinent test results  History of Anesthesia Complications (+) PONV and history of anesthetic complications  Airway Mallampati: I  TM Distance: >3 FB Neck ROM: Full    Dental no notable dental hx. (+) Dental Advidsory Given, Teeth Intact   Pulmonary neg pulmonary ROS, neg sleep apnea, neg COPD,    breath sounds clear to auscultation- rhonchi (-) wheezing      Cardiovascular Exercise Tolerance: Good (-) hypertension(-) CAD and (-) Past MI  Rhythm:Regular Rate:Normal - Systolic murmurs and - Diastolic murmurs    Neuro/Psych negative neurological ROS  negative psych ROS   GI/Hepatic Neg liver ROS, GERD  ,  Endo/Other  negative endocrine ROSneg diabetes  Renal/GU negative Renal ROS     Musculoskeletal negative musculoskeletal ROS (+)   Abdominal (+) - obese, Gravid abdomen   Peds  Hematology negative hematology ROS (+)   Anesthesia Other Findings Past Medical History: No date: GERD (gastroesophageal reflux disease)     Comment:  during pregnancy No date: PONV (postoperative nausea and vomiting)     Comment:  nausea with laparoscopy   Reproductive/Obstetrics (+) Breast feeding                             Anesthesia Physical  Anesthesia Plan  ASA: II  Anesthesia Plan: General   Post-op Pain Management:    Induction: Intravenous  PONV Risk Score and Plan: 4 or greater and Propofol infusion, TIVA, Ondansetron, Dexamethasone, Midazolam and Promethazine  Airway Management Planned: Oral ETT  Additional Equipment:   Intra-op Plan:   Post-operative Plan: Extubation in OR  Informed Consent: I have reviewed the patients History and Physical, chart, labs and discussed the procedure including the risks, benefits and  alternatives for the proposed anesthesia with the patient or authorized representative who has indicated his/her understanding and acceptance.     Plan Discussed with: CRNA and Anesthesiologist  Anesthesia Plan Comments:        Anesthesia Quick Evaluation

## 2018-02-20 NOTE — Progress Notes (Signed)
Husband at bedside.  

## 2018-02-21 ENCOUNTER — Encounter: Payer: Self-pay | Admitting: Surgery

## 2018-02-22 LAB — SURGICAL PATHOLOGY

## 2018-03-02 ENCOUNTER — Telehealth: Payer: Self-pay

## 2018-03-02 NOTE — Telephone Encounter (Signed)
FMLA/DISABILITY form for [employer] filled out, signature obtained and given to [place] for processing. 

## 2018-03-07 ENCOUNTER — Ambulatory Visit (INDEPENDENT_AMBULATORY_CARE_PROVIDER_SITE_OTHER): Payer: 59 | Admitting: Surgery

## 2018-03-07 ENCOUNTER — Encounter: Payer: Self-pay | Admitting: Surgery

## 2018-03-07 ENCOUNTER — Other Ambulatory Visit: Payer: Self-pay

## 2018-03-07 VITALS — BP 118/79 | HR 64 | Temp 97.9°F | Wt 136.0 lb

## 2018-03-07 DIAGNOSIS — Z09 Encounter for follow-up examination after completed treatment for conditions other than malignant neoplasm: Secondary | ICD-10-CM

## 2018-03-07 NOTE — Progress Notes (Signed)
S/p hemorrhoidectomy x 2 Path d/w pt Doing very well Minimal pain  PE NAD Abd: soft, nt Rectal: wounds healing well, no infection or perineal sepsis  A/p Doing very well continue sitz baths and fiber RTC prn

## 2018-03-07 NOTE — Patient Instructions (Signed)
Please give [place] a call in case you have any questions or concerns.  Please continue to do the sitz baths.

## 2018-03-11 ENCOUNTER — Telehealth: Payer: Self-pay

## 2018-03-11 NOTE — Telephone Encounter (Signed)
Find paper work. Patient notified that it was faxed on 03/03/18

## 2018-03-11 NOTE — Telephone Encounter (Signed)
So not sure why this was sent to me once I sign it I'm not the one faxing it

## 2018-03-11 NOTE — Telephone Encounter (Signed)
Pt called triage about her hospital admit program paper work? Not sure what pt was referring to, called and left message for her to call back

## 2018-03-11 NOTE — Telephone Encounter (Signed)
Patient is requesting to see if her Indemnity form has been filled? Medical Records out of the office until Monday. Will follow up with Her,then notify patient.

## 2018-03-18 ENCOUNTER — Encounter: Payer: Self-pay | Admitting: Obstetrics and Gynecology

## 2018-03-18 ENCOUNTER — Ambulatory Visit (INDEPENDENT_AMBULATORY_CARE_PROVIDER_SITE_OTHER): Payer: 59 | Admitting: Obstetrics and Gynecology

## 2018-03-18 NOTE — Progress Notes (Signed)
Postpartum Visit  Chief Complaint:  Chief Complaint  Patient presents with  . Postpartum Care    C/S 02/03/18    History of Present Illness: Patient is a 37 y.o. [id number] presents for postpartum visit.  Date of delivery:  Cesarean Section: Elective repeat Pregnancy or labor problems:  Yes polyhydramnios Any problems since the delivery:  no  Newborn Details:  SINGLETON :  1. BabyGender female. Birth weight:7lbs 1oz Maternal Details:  Breast or formula feeding: plans to breastfeed Contraception after delivery: Yes  Any bowel or bladder issues: No (Other than recent hemorroidectomy)  Post partum depression/anxiety noted:  no Date of last PAP: 02/08/2016  NIL and HR HPV+   Review of Systems: Review of Systems  Constitutional: Negative for chills and fever.  HENT: Negative for congestion.   Respiratory: Negative for cough and shortness of breath.   Cardiovascular: Negative for chest pain and palpitations.  Gastrointestinal: Negative for abdominal pain, constipation, diarrhea, heartburn, nausea and vomiting.  Genitourinary: Negative for dysuria, frequency and urgency.  Skin: Negative for itching and rash.  Neurological: Negative for dizziness and headaches.  Endo/Heme/Allergies: Negative for polydipsia.  Psychiatric/Behavioral: Negative for depression.    The following portions of the patient's history were reviewed and updated as appropriate: allergies, current medications, past family history, past medical history, past social history, past surgical history and problem list.  Past Medical History:  Past Medical History:  Diagnosis Date  . GERD (gastroesophageal reflux disease)    during pregnancy  . PONV (postoperative nausea and vomiting)    nausea with laparoscopy    Past Surgical History:  Past Surgical History:  Procedure Laterality Date  . CESAREAN SECTION  02/2014  . CESAREAN SECTION N/A 12/19/2015   Procedure: CESAREAN SECTION;  Surgeon: Malachy Mood,  MD;  Location: [facility] ORS;  Service: Obstetrics;  Laterality: N/A;  Time of Birth 08:24 Sex: female Weight: 8 lb 2 oz  . CESAREAN SECTION  02/03/2018  . CESAREAN SECTION WITH BILATERAL TUBAL LIGATION N/A 02/03/2018   Procedure: CESAREAN SECTION WITH BILATERAL TUBAL LIGATION;  Surgeon: Malachy Mood, MD;  Location: [facility] ORS;  Service: Obstetrics;  Laterality: N/A;  . diagnoastic lap  2013  . DILATION AND CURETTAGE OF UTERUS    . endometriosis     history of  . EVALUATION UNDER ANESTHESIA WITH HEMORRHOIDECTOMY N/A 02/20/2018   Procedure: EXAM UNDER ANESTHESIA WITH HEMORRHOIDECTOMY;  Surgeon: Jules Husbands, MD;  Location: [facility] ORS;  Service: General;  Laterality: N/A;  . HERNIA REPAIR Right 1984  . INTRAUTERINE DEVICE (IUD) INSERTION  2015  . IUD REMOVAL  2016  . TUBAL LIGATION  02/03/2018    Family History:  Family History  Problem Relation Age of Onset  . Thyroid disease Mother   . Hyperlipidemia Father   . Squamous cell carcinoma Father     Social History:  Social History   Socioeconomic History  . Marital status: Married    Spouse name: Not on file  . Number of children: Not on file  . Years of education: Not on file  . Highest education level: Not on file  Occupational History  . Occupation: Therapist, sports -OR    Employer: [employer]  . Financial resource strain: Not hard at all  . Food insecurity:    Worry: Never true    Inability: Not on file  . Transportation needs:    Medical: No    Non-medical: No  Tobacco Use  . Smoking status: Never Smoker  .  Smokeless tobacco: Never Used  Substance and Sexual Activity  . Alcohol use: No  . Drug use: No  . Sexual activity: Yes    Birth control/protection: IUD  Lifestyle  . Physical activity:    Days per week: 0 days    Minutes per session: 0 min  . Stress: Not at all  Relationships  . Social connections:    Talks on phone: More than three times a week    Gets together: More than three times a week    Attends  religious service: Never    Active member of club or organization: Yes    Attends meetings of clubs or organizations: Never    Relationship status: Married  . Intimate partner violence:    Fear of current or ex partner: Not on file    Emotionally abused: Not on file    Physically abused: Not on file    Forced sexual activity: Not on file  Other Topics Concern  . Not on file  Social History Narrative  . Not on file    Allergies:  Allergies  Allergen Reactions  . Zantac [Ranitidine Hcl] Anaphylaxis  . Penicillins Rash    Has patient had a PCN reaction causing immediate rash, facial/tongue/throat swelling, SOB or lightheadedness with hypotension: Yes Has patient had a PCN reaction causing severe rash involving mucus membranes or skin necrosis: Yes Has patient had a PCN reaction that required hospitalization No Has patient had a PCN reaction occurring within the last 10 years: No If all of the above answers are "NO", then may proceed with Cephalosporin use.   . Procardia [Nifedipine] Rash    Medications: Prior to Admission medications   Medication Sig Start Date End Date Taking? Authorizing Provider  Prenatal Vit-Fe Fumarate-FA (MULTIVITAMIN-PRENATAL) 27-0.8 MG TABS tablet Take 1 tablet by mouth daily.     [provider]    Physical Exam Blood pressure (!) 108/52, pulse 78, height 5' (1.524 m), weight 140 lb (63.5 kg), currently breastfeeding.  currently breastfeeding.  General: NAD HEENT: normocephalic, anicteric Pulmonary: No increased work of breathing Abdomen: NABS, soft, non-tender, non-distended.  Umbilicus without lesions.  No hepatomegaly, splenomegaly or masses palpable. No evidence of hernia. Incision D/C/I Genitourinary:  External: Normal external female genitalia.  Normal urethral meatus, normal  Bartholin's and Skene's glands.    Vagina: Normal vaginal mucosa, no evidence of prolapse.    Cervix: Grossly normal in appearance, no bleeding  Uterus:  Non-enlarged, mobile, normal contour.  No CMT  Adnexa: ovaries non-enlarged, no adnexal masses  Rectal: deferred Extremities: no edema, erythema, or tenderness Neurologic: Grossly intact Psychiatric: mood appropriate, affect full    Assessment: 37 y.o. [id number] presenting for 6 week postpartum visit  Plan: Problem List Items Addressed This Visit    None    Visit Diagnoses    6 weeks postpartum follow-up    -  Primary      1) Contraception - Education given regarding options for contraception, as well as compatibility with breast feeding if applicable.  Patient is status post tubal ligation  2)  Pap - ASCCP guidelines and rational discussed.  ASCCP guidelines and rational discussed.  Patient opts for every 3 years screening interval  3) Patient underwent screening for postpartum depression with no signs of depression  4) Return in about 1 year (around 03/19/2019) for annual.   Malachy Mood, MD, [place], [place] 03/18/2018, 10:22 AM

## 2018-03-29 DIAGNOSIS — H52223 Regular astigmatism, bilateral: Secondary | ICD-10-CM | POA: Diagnosis not present

## 2018-03-29 DIAGNOSIS — H5213 Myopia, bilateral: Secondary | ICD-10-CM | POA: Diagnosis not present

## 2018-07-27 DIAGNOSIS — R509 Fever, unspecified: Secondary | ICD-10-CM | POA: Diagnosis not present

## 2018-07-27 DIAGNOSIS — R6889 Other general symptoms and signs: Secondary | ICD-10-CM | POA: Diagnosis not present

## 2018-08-24 DIAGNOSIS — Z0000 Encounter for general adult medical examination without abnormal findings: Secondary | ICD-10-CM | POA: Diagnosis not present

## 2018-11-25 DIAGNOSIS — Z0000 Encounter for general adult medical examination without abnormal findings: Secondary | ICD-10-CM | POA: Diagnosis not present

## 2019-06-05 ENCOUNTER — Telehealth: Payer: Self-pay

## 2019-06-05 NOTE — Telephone Encounter (Signed)
Pt will contact [facility] directly. Feels as though nurse should've called her instead of sending her to the schedulers.

## 2019-06-05 NOTE — Telephone Encounter (Signed)
Pt needs an annual exam, not seen since 03/2018. Please schedule and pt can discuss concerns at that time

## 2019-06-05 NOTE — Telephone Encounter (Signed)
Pt calling; no bc for one yr; child is 73months old; she is almost 39hrs old; has terrible acne and PMS the week before her period; periods are very regular - no problems at all with them; she has had a tubal.  Would bc help with the acne and PMS?  [phone]

## 2019-07-13 DIAGNOSIS — J0110 Acute frontal sinusitis, unspecified: Secondary | ICD-10-CM | POA: Diagnosis not present

## 2019-08-28 IMAGING — US US MFM OB DETAIL+14 WK
1 series · 12 of 28 positions shown · non-contrast
Comparison: none

PATIENT INFO:

PERFORMED BY:
Sonographer
SERVICE(S) PROVIDED:
INDICATIONS:
36 weeks gestation of pregnancy
Polyhydraminos
IVF pregnancy
FETAL EVALUATION:
Num Of Fetuses:         1
Fetal Heart Rate(bpm):  153
Cardiac Activity:       Present
Presentation:           Breech
Placenta:               Anterior, no previa
P. Cord Insertion:      Normal
Amniotic Fluid
AFI FV:      Polyhydramnios
AFI Sum(cm)     %Tile       Largest Pocket(cm)
31.4            > 97
BIOMETRY:
BPD:      93.6  mm     G. Age:  38w 1d         91  %    CI:        75.61   %    70 - 86
FL/HC:      21.4   %    20.8 -
HC:      341.3  mm     G. Age:  39w 2d         82  %    HC/AC:      1.03        0.92 -
AC:       330   mm     G. Age:  36w 6d         68  %    FL/BPD:     78.0   %    71 - 87
FL:         73  mm     G. Age:  37w 3d         64  %    FL/AC:      22.1   %    20 - 24
HUM:      63.3  mm     G. Age:  36w 5d         68  %
Est. FW:    0861  gm      7 lb 1 oz     69  %
GESTATIONAL AGE:
LMP:           37w 0d        Date:  05/13/17                 EDD:   02/17/18
U/S Today:     38w 0d                                        EDD:   02/10/18
Best:          36w 5d     Det. By:  Embryo Transfer          EDD:   02/19/18
(06/03/17)
ANATOMY:
Cranium:               Within Normal Limits   Aortic Arch:            Normal appearance
Cavum:                 CSP visualized         Ductal Arch:            Normal appearance
Ventricles:            Normal appearance      Diaphragm:              Within Normal Limits
Choroid Plexus:        Within Normal Limits   Stomach:                Seen
Cerebellum:            Within Normal Limits   Abdomen:                Within Normal
Limits
Posterior Fossa:       Suboptimal             Abdominal Wall:         Normal appearance
Nuchal Fold:           Within Normal Limits   Cord Vessels:           3 vessels
Face:                  Orbits visualized      Kidneys:                Normal appearance
Lips:                  Normal appearance      Bladder:                Seen
Thoracic:              Within Normal Limits   Spine:                  Normal appearance
Heart:                 4-Chamber view         Upper Extremities:      Visualized
appears normal
RVOT:                  Normal appearance      Lower Extremities:      Visualized
LVOT:                  Normal appearance

[Series 1: us mfm ob detail+14 wk · 0.28mm/px · 12 of 75 slices shown]
[im 3/75]
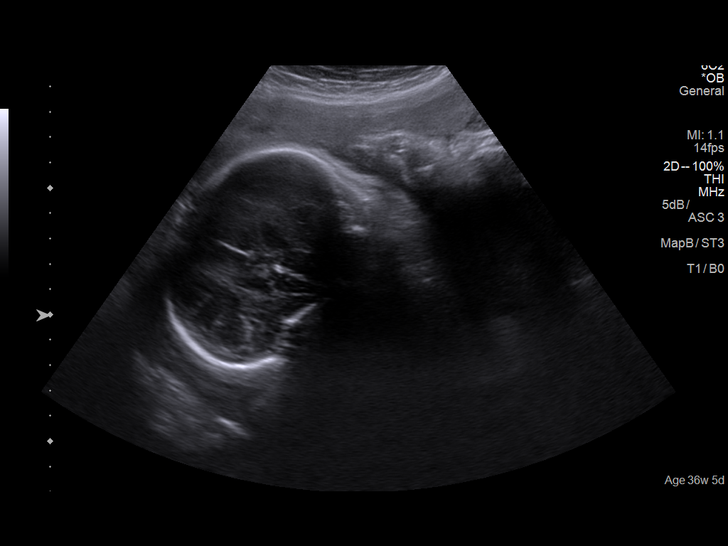
[im 9/75]
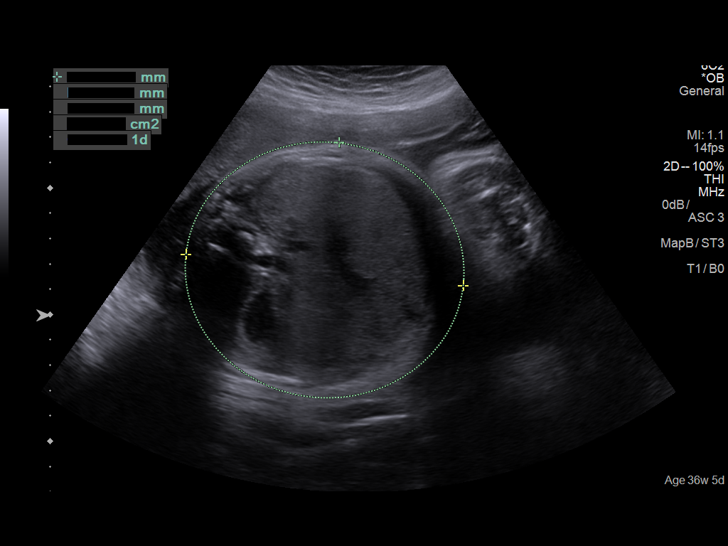
[im 14/75]
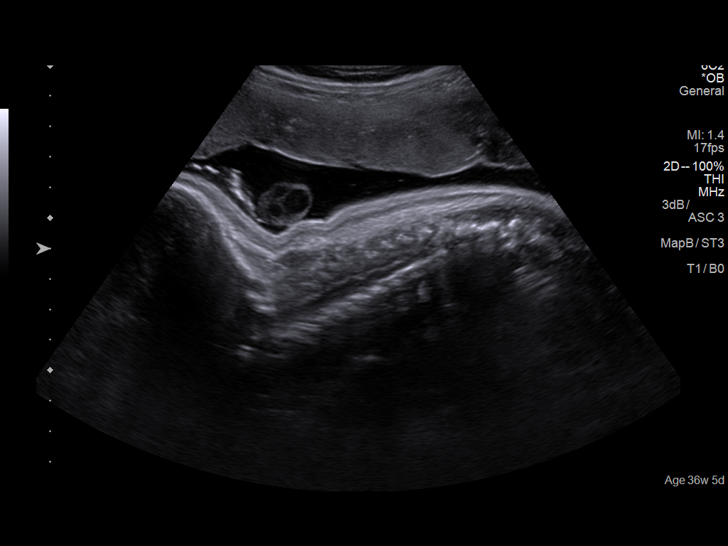
[im 22/75]
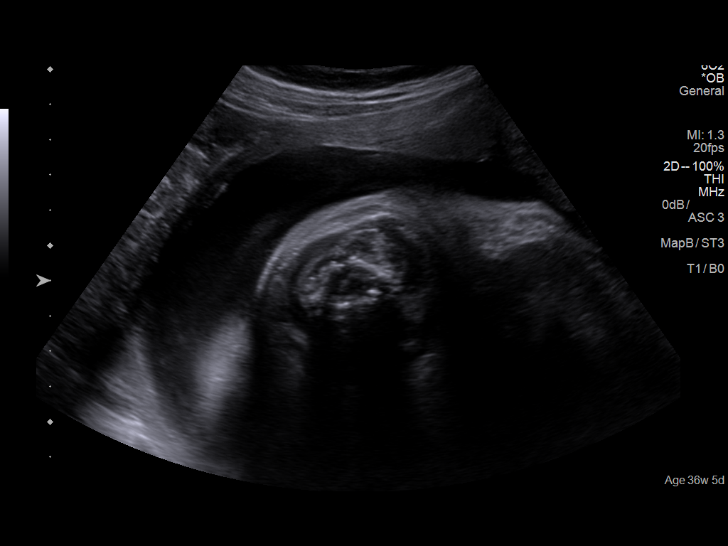
[im 28/75]
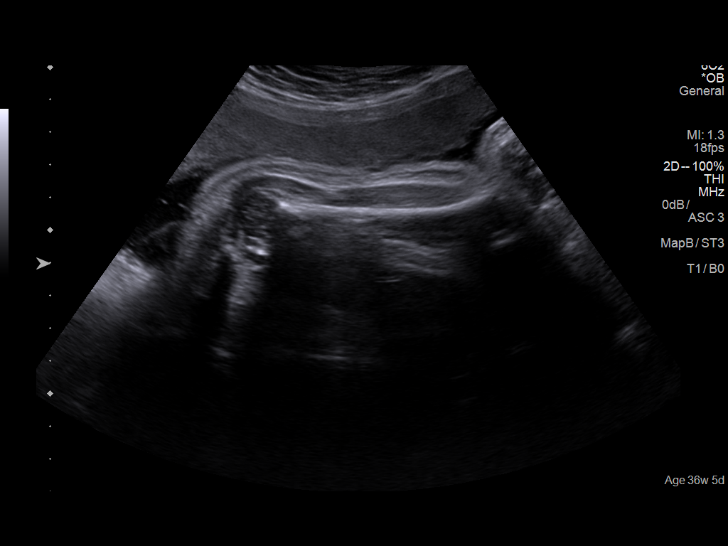
[im 33/75]
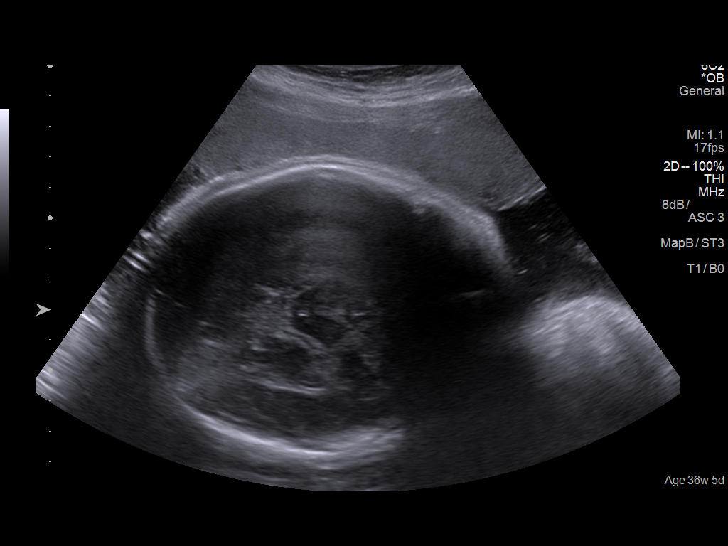
[im 42/75]
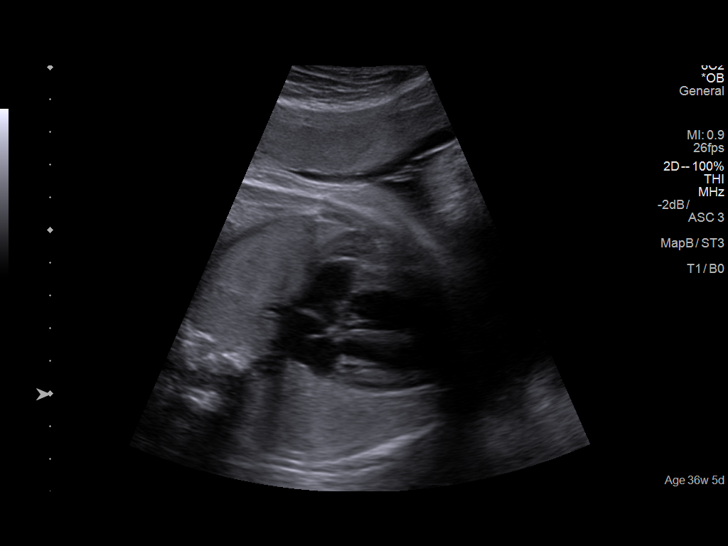
[im 47/75]
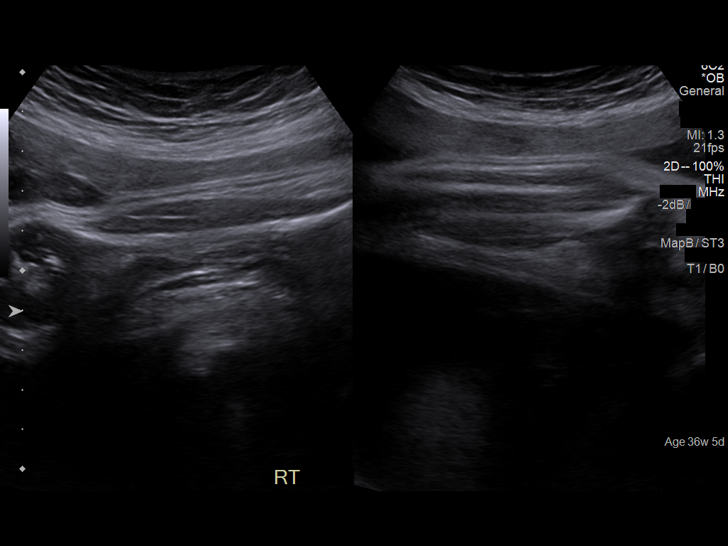
[im 53/75]
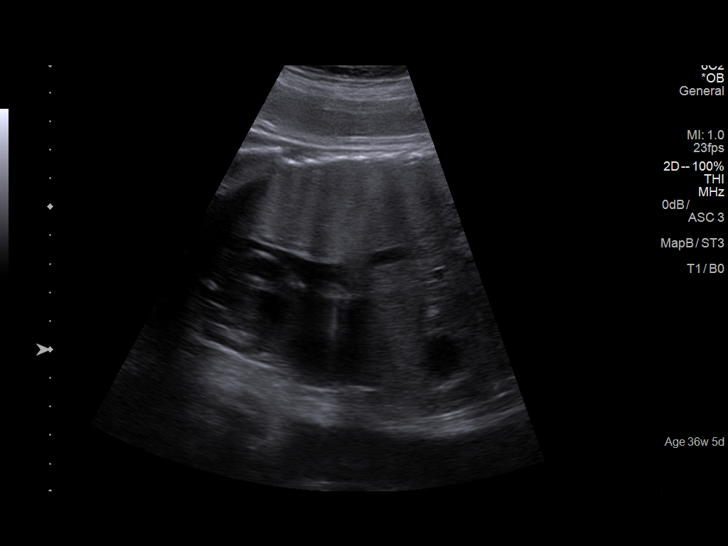
[im 61/75]
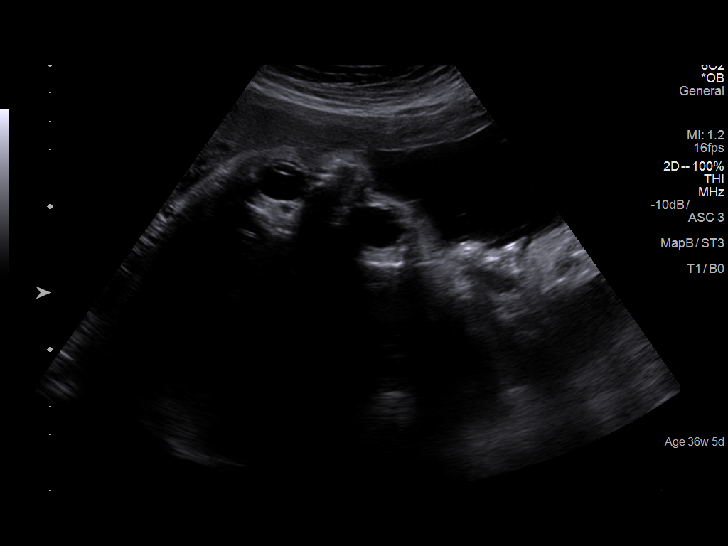
[im 66/75]
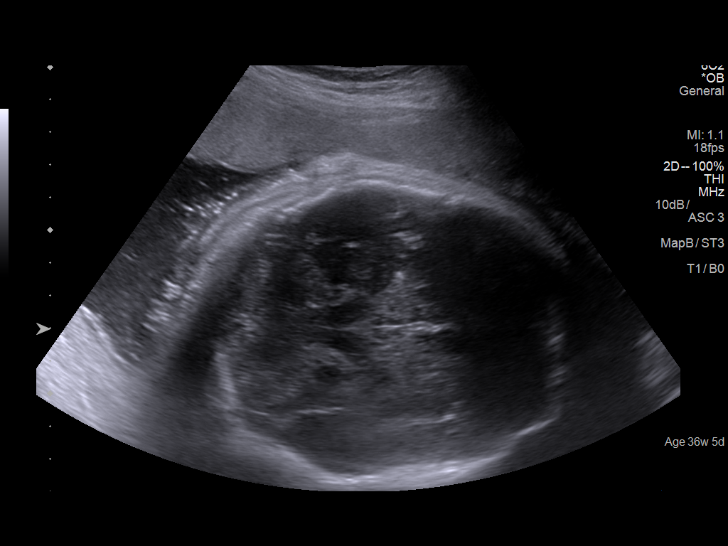
[im 72/75]
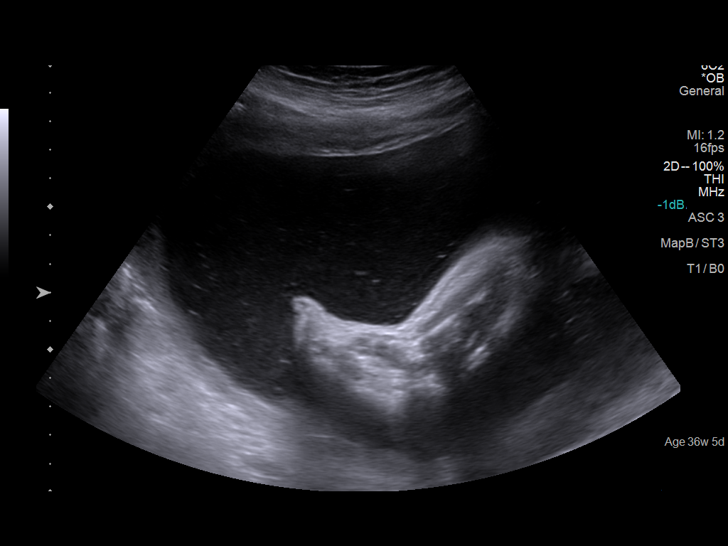

[12 of 28 positions shown; findings below may reference images not displayed]

IMPRESSION: Ms. Jamshit presents for detailed ultrasound in setting of
known polyhydramnios.  Her last AFI at [facility] was 34 cm.

Ultrasound demonstrates a single live pregnancy at 36 [DATE]
weeks.  Dating is by IVF transfer date of 06/03/17 of day 5
embryos.

The posterior fossa is suboptimally visualized but the
remainder of the visualized fetal anatomy appears normal.
There is polyhydramnios with an MVP of 10.7 and an AFI of
31.4.  The stomach and abdomen have a normal
appearance.  There is normal fetal movement seen
throughout the study.  There is no placenta previa.  The fetal
growth is appropriate.

The findings were discussed.  Ms. Jamshit had normal cell-
free fetal DNA testing this pregnancy and a normal fetal echo.
She had a normal glucola.  With a normal anatomy scan and
AFI less than 35, the most likely diagnosis is idiopathic
polyhydramnios.  Though most cases of polyhydramnios are
idiopathic, we discussed the association of polyhydramnios
with maternal diabetes and fetal conditions such as
aneuploidy, movement disorders and structural anomalies.
Women with an AFI over 35 have a slightly higher risk of
having an underlying fetal or neonatal abnormalitiy and as
such, women with an AFI over 35 should be offered delivery
at a tertiary care facility.  Antenatal fetal surveillance for the
sole indication of polyhydramnios is not required but it is
reasonable to follow her amniotic fluid index to ensure that it
is not increasing. Ms. Jamshit is planning repeat cesarean
delivery.  Recommend delivery at 39 weeks unless she
develops other indications for delivery prior, or is becoming
symptomatic from the polhydramnios.  Please alert the
hospital peditricians of the polyhydramnios finding so that the
first initial feeds can be monitored.

Ref: Society for Maternal-Fetal Medicine, Dashe [REDACTED] et al.
SMFM Consult Series #46: Evaluation and management of
polyhdramnios. AJOG, February 2017.

## 2019-09-05 DIAGNOSIS — Z0000 Encounter for general adult medical examination without abnormal findings: Secondary | ICD-10-CM | POA: Diagnosis not present

## 2019-09-08 ENCOUNTER — Encounter: Payer: Self-pay | Admitting: Obstetrics and Gynecology

## 2019-09-08 ENCOUNTER — Other Ambulatory Visit: Payer: Self-pay

## 2019-09-08 ENCOUNTER — Ambulatory Visit (INDEPENDENT_AMBULATORY_CARE_PROVIDER_SITE_OTHER): Payer: 59 | Admitting: Obstetrics and Gynecology

## 2019-09-08 ENCOUNTER — Other Ambulatory Visit (HOSPITAL_COMMUNITY)
Admission: RE | Admit: 2019-09-08 | Discharge: 2019-09-08 | Disposition: A | Discharge status: Home / Self Care | Payer: 59 | Source: Ambulatory Visit | Attending: Obstetrics and Gynecology | Admitting: Obstetrics and Gynecology

## 2019-09-08 ENCOUNTER — Other Ambulatory Visit: Payer: Self-pay | Admitting: Obstetrics and Gynecology

## 2019-09-08 VITALS — BP 90/70 | Ht 60.0 in | Wt 146.0 lb

## 2019-09-08 DIAGNOSIS — Z1329 Encounter for screening for other suspected endocrine disorder: Secondary | ICD-10-CM | POA: Diagnosis not present

## 2019-09-08 DIAGNOSIS — Z130 Encounter for screening for diseases of the blood and blood-forming organs and certain disorders involving the immune mechanism: Secondary | ICD-10-CM | POA: Diagnosis not present

## 2019-09-08 DIAGNOSIS — Z124 Encounter for screening for malignant neoplasm of cervix: Secondary | ICD-10-CM | POA: Insufficient documentation

## 2019-09-08 DIAGNOSIS — F3281 Premenstrual dysphoric disorder: Secondary | ICD-10-CM | POA: Diagnosis not present

## 2019-09-08 DIAGNOSIS — Z30011 Encounter for initial prescription of contraceptive pills: Secondary | ICD-10-CM

## 2019-09-08 DIAGNOSIS — Z0000 Encounter for general adult medical examination without abnormal findings: Secondary | ICD-10-CM

## 2019-09-08 DIAGNOSIS — Z131 Encounter for screening for diabetes mellitus: Secondary | ICD-10-CM

## 2019-09-08 MED ORDER — NORGESTIM-ETH ESTRAD TRIPHASIC 0.18/0.215/0.25 MG-25 MCG PO TABS: 1.0000 | ORAL_TABLET | Freq: Every day | ORAL | 12 refills | Status: DC

## 2019-09-08 MED ORDER — LEVONORGEST-ETH ESTRAD 91-DAY 0.15-0.03 &0.01 MG PO TABS: 1.0000 | ORAL_TABLET | Freq: Every day | ORAL | 4 refills | Status: DC

## 2019-09-08 NOTE — Patient Instructions (Signed)
Institute of [place] for Calcium and Vitamin D  Age (yr) Calcium Recommended Dietary Allowance (mg/day) Vitamin D Recommended Dietary Allowance (international units/day)  9-18 1,300 600  19-50 1,000 600  51-70 1,200 600  71 and older 1,200 800  Data from Institute of Medicine. Dietary reference intakes: calcium, vitamin D. [place], [place]: [employer]; 2011.     Budget-Friendly Healthy Eating There are many ways to save money at the grocery store and continue to eat healthy. You can be successful if you:  Plan meals according to your budget.  Make a grocery list and only purchase food according to your grocery list.  Prepare food yourself. What are tips for following this plan?  Reading food labels  Compare food labels between brand name foods and the store brand. Often the nutritional value is the same, but the store brand is lower cost.  Look for products that do not have added sugar, fat, or salt (sodium). These often cost the same but are healthier for you. Products may be labeled as: ? Sugar-free. ? Nonfat. ? Low-fat. ? Sodium-free. ? Low-sodium.  Look for lean ground beef labeled as at least 92% lean and 8% fat. Shopping  Buy only the items on your grocery list and go only to the areas of the store that have the items on your list.  Use coupons only for foods and brands you normally buy. Avoid buying items you wouldn't normally buy simply because they are on sale.  Check online and in newspapers for weekly deals.  Buy healthy items from the bulk bins when available, such as herbs, spices, flour, pasta, nuts, and dried fruit.  Buy fruits and vegetables that are in season. Prices are usually lower on in-season produce.  Look at the unit price on the price tag. Use it to compare different brands and sizes to find out which item is the best deal.  Choose healthy items that are often low-cost, such as carrots,  potatoes, apples, bananas, and oranges. Dried or canned beans are a low-cost protein source.  Buy in bulk and freeze extra food. Items you can buy in bulk include meats, fish, poultry, frozen fruits, and frozen vegetables.  Avoid buying "ready-to-eat" foods, such as pre-cut fruits and vegetables and pre-made salads.  If possible, shop around to discover where you can find the best prices. Consider other retailers such as dollar stores, larger [employer], local fruit and vegetable stands, and farmers markets.  Do not shop when you are hungry. If you shop while hungry, it may be hard to stick to your list and budget.  Resist impulse buying. Use your grocery list as your official plan for the week.  Buy a variety of vegetables and fruits by purchasing fresh, frozen, and canned items.  Look at the top and bottom shelves for deals. Foods at eye level (eye level of an adult or child) are usually more expensive.  Be efficient with your time when shopping. The more time you spend at the store, the more money you are likely to spend.  To save money when choosing more expensive foods like meats and dairy: ? Choose cheaper cuts of meat, such as bone-in chicken thighs and drumsticks instead of skinless and boneless chicken. When you are ready to prepare the chicken, you can remove the skin yourself to make it healthier. ? Choose lean meats like chicken or [place] instead of beef. ? Choose canned seafood, such as tuna, salmon, or sardines. ? Buy  as a low-cost source of protein. ? Buy dried beans and peas, such as lentils, split peas, or kidney beans instead of meats. Dried beans and peas are a good alternative source of protein. ? Buy the larger tubs of yogurt instead of individual-sized containers.  Choose water instead of sodas and other sweetened beverages.  Avoid buying chips, cookies, and other "junk food." These items are usually expensive and not healthy. Cooking  Make extra food  and freeze the extras in meal-sized containers or in individual portions for fast meals and snacks.  Pre-cook on days when you have extra time to prepare meals in advance. You can keep these meals in the fridge or freezer and reheat for a quick meal.  When you come home from the grocery store, wash, peel, and cut fruits and vegetables so they are ready to use and eat. This will help reduce food waste. Meal planning  Do not eat out or get fast food. Prepare food at home.  Make a grocery list and make sure to bring it with you to the store. If you have a smart phone, you could use your phone to create your shopping list.  Plan meals and snacks according to a grocery list and budget you create.  Use leftovers in your meal plan for the week.  Look for recipes where you can cook once and make enough food for two meals.  Include budget-friendly meals like stews, casseroles, and stir-fry dishes.  Try some meatless meals or try "no cook" meals like salads.  Make sure that half your plate is filled with fruits or vegetables. Choose from fresh, frozen, or canned fruits and vegetables. If eating canned, remember to rinse them before eating. This will remove any excess salt added for packaging. Summary  Eating healthy on a budget is possible if you plan your meals according to your budget, purchase according to your budget and grocery list, and prepare food yourself.  Tips for buying more food on a limited budget include buying generic brands, using coupons only for foods you normally buy, and buying healthy items from the bulk bins when available.  Tips for buying cheaper food to replace expensive food include choosing cheaper, lean cuts of meat, and buying dried beans and peas. This information is not intended to replace advice given to you by your health care provider. Make sure you discuss any questions you have with your health care provider. Document Revised: 05/05/2017 Document Reviewed:  05/05/2017 Elsevier Patient Education  2020 Elsevier Inc.   Exercising to Stay Healthy To become healthy and stay healthy, it is recommended that you do moderate-intensity and vigorous-intensity exercise. You can tell that you are exercising at a moderate intensity if your heart starts beating faster and you start breathing faster but can still hold a conversation. You can tell that you are exercising at a vigorous intensity if you are breathing much harder and faster and cannot hold a conversation while exercising. Exercising regularly is important. It has many health benefits, such as:  Improving overall fitness, flexibility, and endurance.  Increasing bone density.  Helping with weight control.  Decreasing body fat.  Increasing muscle strength.  Reducing stress and tension.  Improving overall health. How often should I exercise? Choose an activity that you enjoy, and set realistic goals. Your health care provider can help you make an activity plan that works for you. Exercise regularly as told by your health care provider. This may include:  Doing strength training two times a   a week, such as: ? Lifting weights. ? Using resistance bands. ? Push-ups. ? Sit-ups. ? Yoga.  Doing a certain intensity of exercise for a given amount of time. Choose from these options: ? A total of 150 minutes of moderate-intensity exercise every week. ? A total of 75 minutes of vigorous-intensity exercise every week. ? A mix of moderate-intensity and vigorous-intensity exercise every week. Children, pregnant women, people who have not exercised regularly, people who are overweight, and older adults may need to talk with a health care provider about what activities are safe to do. If you have a medical condition, be sure to talk with your health care provider before you start a new exercise program. What are some exercise ideas? Moderate-intensity exercise ideas include:  Walking 1 mile (1.6 km) in  about 15 minutes.  Biking.  Hiking.  Golfing.  Dancing.  Water aerobics. Vigorous-intensity exercise ideas include:  Walking 4.5 miles (7.2 km) or more in about 1 hour.  Jogging or running 5 miles (8 km) in about 1 hour.  Biking 10 miles (16.1 km) or more in about 1 hour.  Lap swimming.  Roller-skating or in-line skating.  Cross-country skiing.  Vigorous competitive sports, such as football, basketball, and soccer.  Jumping rope.  Aerobic dancing. What are some everyday activities that can help me to get exercise?  [place] work, such as: ? Pushing a Conservation officer, nature. ? Raking and bagging leaves.  Washing your car.  Pushing a stroller.  Shoveling snow.  Gardening.  Washing windows or floors. How can I be more active in my day-to-day activities?  Use stairs instead of an elevator.  Take a walk during your lunch break.  If you drive, park your car farther away from your work or school.  If you take public transportation, get off one stop early and walk the rest of the way.  Stand up or walk around during all of your indoor phone calls.  Get up, stretch, and walk around every 30 minutes throughout the day.  Enjoy exercise with a friend. Support to continue exercising will help you keep a regular routine of activity. What guidelines can I follow while exercising?  Before you start a new exercise program, talk with your health care provider.  Do not exercise so much that you hurt yourself, feel dizzy, or get very short of breath.  Wear comfortable clothes and wear shoes with good support.  Drink plenty of water while you exercise to prevent dehydration or heat stroke.  Work out until your breathing and your heartbeat get faster. Where to find more information  U.S. Department of Health and Human Services: [employer]  Centers for Disease Control and Prevention (CDC): http://www.wolf.info/ Summary  Exercising regularly is important. It will improve your overall  fitness, flexibility, and endurance.  Regular exercise also will improve your overall health. It can help you control your weight, reduce stress, and improve your bone density.  Do not exercise so much that you hurt yourself, feel dizzy, or get very short of breath.  Before you start a new exercise program, talk with your health care provider. This information is not intended to replace advice given to you by your health care provider. Make sure you discuss any questions you have with your health care provider. Document Revised: 04/16/2017 Document Reviewed: 03/25/2017 Elsevier Patient Education  2020 [employer].

## 2019-09-08 NOTE — Progress Notes (Signed)
Gynecology Annual Exam   PCP: Dion Body, MD  Chief Complaint:  Chief Complaint  Patient presents with  . Gynecologic Exam  . Contraception    for acne and PMS    History of Present Illness: Patient is a 39 y.o. [id number] presents for annual exam. The patient has no complaints today.   LMP: Patient's last menstrual period was 09/04/2019 (exact date). Average Interval: regular, 28 days Duration of flow: 5-7 days Heavy Menses: no Clots: occassionally Intermenstrual Bleeding: no Postcoital Bleeding: no Dysmenorrhea: no  She reports that since her tubal and discontinuing hormonal medication she has noticed increased bothersome back acne, pain with ovulation, vaginal discharge, and is moody a day or two before her period and will pick fights for no reason. She is interested in starting a birth control pill to mitigate these symptoms.   The patient is sexually active. She currently uses tubal ligation for contraception. She denies dyspareunia.  There is no notable family history of breast or ovarian cancer in her family.  The patient has regular exercise: Has started walking for 15-20 minutes at home. She generally gets 17,000 steps a day.    The patient denies current symptoms of depression.    Review of Systems: Review of Systems  Constitutional: Negative for chills, fever, malaise/fatigue and weight loss.  HENT: Negative for congestion, hearing loss and sinus pain.   Eyes: Negative for blurred vision and double vision.  Respiratory: Negative for cough, sputum production, shortness of breath and wheezing.   Cardiovascular: Negative for chest pain, palpitations, orthopnea and leg swelling.  Gastrointestinal: Negative for abdominal pain, constipation, diarrhea, nausea and vomiting.  Genitourinary: Negative for dysuria, flank pain, frequency, hematuria and urgency.  Musculoskeletal: Negative for back pain, falls and joint pain.  Skin: Negative for itching and rash.    Neurological: Negative for dizziness and headaches.  Psychiatric/Behavioral: Negative for depression, substance abuse and suicidal ideas. The patient is not nervous/anxious.     Past Medical History:  Patient Active Problem List   Diagnosis Date Noted  . Internal and external strangulated hemorrhoids   . Single liveborn, born in hospital, delivered by cesarean delivery 02/06/2018  . Polyhydramnios 01/21/2018  . 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 History of 2 cesarean sections 08/27/2017    Past Surgical History:  Past Surgical History:  Procedure Laterality Date  . CESAREAN SECTION  02/2014  . CESAREAN SECTION N/A 12/19/2015   Procedure: CESAREAN SECTION;  Surgeon: Malachy Mood, MD;  Location: [facility] ORS;  Service: Obstetrics;  Laterality: N/A;  Time of Birth 08:24 Sex: female Weight: 8 lb 2 oz  . CESAREAN SECTION  02/03/2018  . CESAREAN SECTION WITH BILATERAL TUBAL LIGATION N/A 02/03/2018   Procedure: CESAREAN SECTION WITH BILATERAL TUBAL LIGATION;  Surgeon: Malachy Mood, MD;  Location: [facility] ORS;  Service: Obstetrics;  Laterality: N/A;  . diagnoastic lap  2013  . DILATION AND CURETTAGE OF UTERUS    . endometriosis     history of  . EVALUATION UNDER ANESTHESIA WITH HEMORRHOIDECTOMY N/A 02/20/2018   Procedure: EXAM UNDER ANESTHESIA WITH HEMORRHOIDECTOMY;  Surgeon: Jules Husbands, MD;  Location: [facility] ORS;  Service: General;  Laterality: N/A;  . HERNIA REPAIR Right 1984  . INTRAUTERINE DEVICE (IUD) INSERTION  2015  . IUD REMOVAL  2016  . TUBAL LIGATION  02/03/2018    Gynecologic History:  Patient's last menstrual period was 09/04/2019 (exact date). Contraception: tubal ligation Last Pap: Results were: NIL and HR HPV negative   Obstetric  [id number]  Family History:  Family History  Problem Relation Age of Onset  . Thyroid disease Mother   . Hyperlipidemia Father   . Squamous cell carcinoma Father     Social History:  Social History   Socioeconomic History   . Marital status: Married    Spouse name: Not on file  . Number of children: Not on file  . Years of education: Not on file  . Highest education level: Not on file  Occupational History  . Occupation: Therapist, sports -OR    Employer: [employer]  Tobacco Use  . Smoking status: Never Smoker  . Smokeless tobacco: Never Used  Substance and Sexual Activity  . Alcohol use: No  . Drug use: No  . Sexual activity: Yes    Birth control/protection: Surgical    Comment: Tubal ligation  Other Topics Concern  . Not on file  Social History Narrative  . Not on file   Social Determinants of Health   Financial Resource Strain:   . Difficulty of Paying Living Expenses:   Food Insecurity:   . Worried About Charity fundraiser in the Last Year:   . Arboriculturist in the Last Year:   Transportation Needs:   . Film/video editor (Medical):   Marland Kitchen Lack of Transportation (Non-Medical):   Physical Activity:   . Days of Exercise per Week:   . Minutes of Exercise per Session:   Stress:   . Feeling of Stress :   Social Connections:   . Frequency of Communication with Friends and Family:   . Frequency of Social Gatherings with Friends and Family:   . Attends Religious Services:   . Active Member of Clubs or Organizations:   . Attends Archivist Meetings:   Marland Kitchen Marital Status:   Intimate Partner Violence:   . Fear of Current or Ex-Partner:   . Emotionally Abused:   Marland Kitchen Physically Abused:   . Sexually Abused:     Allergies:  Allergies  Allergen Reactions  . Zantac [Ranitidine Hcl] Anaphylaxis  . Penicillins Rash    Has patient had a PCN reaction causing immediate rash, facial/tongue/throat swelling, SOB or lightheadedness with hypotension: Yes Has patient had a PCN reaction causing severe rash involving mucus membranes or skin necrosis: Yes Has patient had a PCN reaction that required hospitalization No Has patient had a PCN reaction occurring within the last 10 years: No If all of the  above answers are "NO", then may proceed with Cephalosporin use.   . Procardia [Nifedipine] Rash    Medications: Prior to Admission medications   Medication Sig Start Date End Date Taking? Authorizing Provider  Norgestimate-Ethinyl Estradiol Triphasic 0.18/0.215/0.25 MG-25 MCG tab Take 1 tablet by mouth daily. 09/08/19   Homero Fellers, MD    Physical Exam Vitals: Blood pressure 90/70, height 5' (1.524 m), weight 146 lb (66.2 kg), last menstrual period 09/04/2019, not currently breastfeeding.  General: NAD HEENT: normocephalic, anicteric Thyroid: no enlargement, no palpable nodules Pulmonary: No increased work of breathing, CTAB Cardiovascular: RRR, distal pulses 2+ Breast: Breast symmetrical, no tenderness, no palpable nodules or masses, no skin or nipple retraction present, no nipple discharge.  No axillary or supraclavicular lymphadenopathy. Abdomen: NABS, soft, non-tender, non-distended.  Umbilicus without lesions.  No hepatomegaly, splenomegaly or masses palpable. No evidence of hernia  Genitourinary:  External: Normal external female genitalia.  Normal urethral meatus, normal Bartholin's and Skene's glands.    Vagina: Normal vaginal mucosa, no evidence of prolapse.  Cervix: Grossly normal in appearance, no bleeding  Uterus: Non-enlarged, mobile, normal contour.  No CMT  Adnexa: ovaries non-enlarged, no adnexal masses  Rectal: deferred  Lymphatic: no evidence of inguinal lymphadenopathy Extremities: no edema, erythema, or tenderness Neurologic: Grossly intact Psychiatric: mood appropriate, affect full  Female chaperone present for pelvic and breast  portions of the physical exam    Assessment: 39 y.o. [id number] routine annual exam  Plan: Problem List Items Addressed This Visit    None    Visit Diagnoses    Healthcare maintenance    -  Primary   Relevant Orders   CBC   TSH + free T4   Hemoglobin A1c   Screening for diabetes mellitus       Relevant Orders    Hemoglobin A1c   Screening for thyroid disorder       Relevant Orders   TSH + free T4   Encounter for BCP (birth control pills) initial prescription       Relevant Medications   Levonorgestrel-Ethinyl Estradiol (SEASONIQUE) 0.15-0.03 &0.01 MG tablet   Screening, anemia, deficiency, iron       Relevant Orders   CBC   PMDD (premenstrual dysphoric disorder)       Screening for cervical cancer       Relevant Orders   Cytology - PAP      2) STI screening  was offered and declined  2)  ASCCP guidelines and rational discussed.  Patient opts for every 3 years screening interval.  3) Contraception - the patient is currently using  tubal ligation.  She is interested in starting Contraception: for help with acne and mood swings.  4) Routine healthcare maintenance including cholesterol, diabetes screening discussed. Ordered today.  5) Return in about 1 year (around 09/07/2020) for annual exam.  Adrian Prows MD [place] 09/08/2019 10:20 AM

## 2019-09-09 LAB — CBC
Hematocrit: 41.5 % (ref 34.0–46.6)
Hemoglobin: 14 g/dL (ref 11.1–15.9)
MCH: 30.5 pg (ref 26.6–33.0)
MCHC: 33.7 g/dL (ref 31.5–35.7)
MCV: 90 fL (ref 79–97)
Platelets: 277 10*3/uL (ref 150–450)
RBC: 4.59 x10E6/uL (ref 3.77–5.28)
RDW: 11.7 % (ref 11.7–15.4)
WBC: 6.6 10*3/uL (ref 3.4–10.8)

## 2019-09-09 LAB — TSH+FREE T4
Free T4: 1.11 ng/dL (ref 0.82–1.77)
TSH: 0.395 u[IU]/mL — ABNORMAL LOW (ref 0.450–4.500)

## 2019-09-09 LAB — HEMOGLOBIN A1C
Est. average glucose Bld gHb Est-mCnc: 97 mg/dL
Hgb A1c MFr Bld: 5 % (ref 4.8–5.6)

## 2019-09-11 LAB — CYTOLOGY - PAP
Comment: NEGATIVE
Diagnosis: NEGATIVE
High risk HPV: NEGATIVE

## 2019-09-12 ENCOUNTER — Other Ambulatory Visit: Payer: Self-pay | Admitting: Obstetrics and Gynecology

## 2019-09-12 DIAGNOSIS — R7989 Other specified abnormal findings of blood chemistry: Secondary | ICD-10-CM

## 2019-09-12 NOTE — Progress Notes (Signed)
Discussed results. Will check Free T3.

## 2019-09-13 ENCOUNTER — Other Ambulatory Visit
Admission: RE | Admit: 2019-09-13 | Discharge: 2019-09-13 | Disposition: A | Discharge status: Home / Self Care | Payer: 59 | Source: Ambulatory Visit | Attending: Obstetrics and Gynecology | Admitting: Obstetrics and Gynecology

## 2019-09-13 DIAGNOSIS — R7989 Other specified abnormal findings of blood chemistry: Secondary | ICD-10-CM | POA: Diagnosis not present

## 2019-09-14 LAB — T3, FREE: T3, Free: 3.1 pg/mL (ref 2.0–4.4)

## 2020-03-20 DIAGNOSIS — H5213 Myopia, bilateral: Secondary | ICD-10-CM | POA: Diagnosis not present

## 2020-03-20 DIAGNOSIS — Z135 Encounter for screening for eye and ear disorders: Secondary | ICD-10-CM | POA: Diagnosis not present

## 2020-08-23 DIAGNOSIS — Z13220 Encounter for screening for lipoid disorders: Secondary | ICD-10-CM | POA: Diagnosis not present

## 2020-08-23 DIAGNOSIS — Z131 Encounter for screening for diabetes mellitus: Secondary | ICD-10-CM | POA: Diagnosis not present

## 2020-08-23 DIAGNOSIS — Z1159 Encounter for screening for other viral diseases: Secondary | ICD-10-CM | POA: Diagnosis not present

## 2020-08-23 DIAGNOSIS — Z0000 Encounter for general adult medical examination without abnormal findings: Secondary | ICD-10-CM | POA: Diagnosis not present

## 2020-09-05 DIAGNOSIS — Z136 Encounter for screening for cardiovascular disorders: Secondary | ICD-10-CM | POA: Diagnosis not present

## 2020-09-05 DIAGNOSIS — Z0000 Encounter for general adult medical examination without abnormal findings: Secondary | ICD-10-CM | POA: Diagnosis not present

## 2020-09-05 DIAGNOSIS — Z131 Encounter for screening for diabetes mellitus: Secondary | ICD-10-CM | POA: Diagnosis not present

## 2020-09-12 ENCOUNTER — Ambulatory Visit (INDEPENDENT_AMBULATORY_CARE_PROVIDER_SITE_OTHER): Payer: 59 | Admitting: Obstetrics and Gynecology

## 2020-09-12 ENCOUNTER — Encounter: Payer: Self-pay | Admitting: Obstetrics and Gynecology

## 2020-09-12 ENCOUNTER — Other Ambulatory Visit: Payer: Self-pay

## 2020-09-12 VITALS — BP 100/60 | Ht 60.0 in | Wt 140.0 lb

## 2020-09-12 DIAGNOSIS — Z3041 Encounter for surveillance of contraceptive pills: Secondary | ICD-10-CM | POA: Diagnosis not present

## 2020-09-12 DIAGNOSIS — Z1239 Encounter for other screening for malignant neoplasm of breast: Secondary | ICD-10-CM

## 2020-09-12 DIAGNOSIS — Z1329 Encounter for screening for other suspected endocrine disorder: Secondary | ICD-10-CM | POA: Diagnosis not present

## 2020-09-12 DIAGNOSIS — R5383 Other fatigue: Secondary | ICD-10-CM

## 2020-09-12 DIAGNOSIS — Z01419 Encounter for gynecological examination (general) (routine) without abnormal findings: Secondary | ICD-10-CM

## 2020-09-12 DIAGNOSIS — R7989 Other specified abnormal findings of blood chemistry: Secondary | ICD-10-CM | POA: Diagnosis not present

## 2020-09-12 DIAGNOSIS — Z30011 Encounter for initial prescription of contraceptive pills: Secondary | ICD-10-CM

## 2020-09-12 MED ORDER — LEVONORGEST-ETH ESTRAD 91-DAY 0.15-0.03 &0.01 MG PO TABS
1.0000 | ORAL_TABLET | Freq: Every day | ORAL | 4 refills | Status: DC
  Filled 2020-09-12: qty 91, 91d supply, fill #0
  Filled 2020-11-27: qty 91, 91d supply, fill #1
  Filled 2021-03-11: qty 91, 91d supply, fill #2
  Filled 2021-06-10: qty 91, 91d supply, fill #3
  Filled 2021-08-28: qty 91, 91d supply, fill #4

## 2020-09-12 NOTE — Patient Instructions (Signed)
In[employer] Recommended Dietary Allowances for Calcium and Vitamin D  Age (yr) Calcium Recommended Dietary Allowance (mg/day) Vitamin D Recommended Dietary Allowance (international units/day)  9-18 1,300 600  19-50 1,000 600  51-70 1,200 600  71 and older 1,200 800  Data from [employer]. Dietary reference intakes: calcium, vitamin D. [place], [place]: National Academies [employer]; 2011.    Exercising to Stay Healthy To become healthy and stay healthy, it is recommended that you do moderate-intensity and vigorous-intensity exercise. You can tell that you are exercising at a moderate intensity if your heart starts beating faster and you start breathing faster but can still hold a conversation. You can tell that you are exercising at a vigorous intensity if you are breathing much harder and faster and cannot hold a conversation while exercising. Exercising regularly is important. It has many health benefits, such as:  Improving overall fitness, flexibility, and endurance.  Increasing bone density.  Helping with weight control.  Decreasing body fat.  Increasing muscle strength.  Reducing stress and tension.  Improving overall health. How often should I exercise? Choose an activity that you enjoy, and set realistic goals. Your health care provider can help you make an activity plan that works for you. Exercise regularly as told by your health care provider. This may include:  Doing strength training two times a week, such as: ? Lifting weights. ? Using resistance bands. ? Push-ups. ? Sit-ups. ? Yoga.  Doing a certain intensity of exercise for a given amount of time. Choose from these options: ? A total of 150 minutes of moderate-intensity exercise every week. ? A total of 75 minutes of vigorous-intensity exercise every week. ? A mix of moderate-intensity and vigorous-intensity exercise every week. Children, pregnant women, people who have not exercised  regularly, people who are overweight, and older adults may need to talk with a health care provider about what activities are safe to do. If you have a medical condition, be sure to talk with your health care provider before you start a new exercise program. What are some exercise ideas? Moderate-intensity exercise ideas include:  Walking 1 mile (1.6 km) in about 15 minutes.  Biking.  Hiking.  Golfing.  Dancing.  Water aerobics. Vigorous-intensity exercise ideas include:  Walking 4.5 miles (7.2 km) or more in about 1 hour.  Jogging or running 5 miles (8 km) in about 1 hour.  Biking 10 miles (16.1 km) or more in about 1 hour.  Lap swimming.  Roller-skating or in-line skating.  Cross-country skiing.  Vigorous competitive sports, such as football, basketball, and soccer.  Jumping rope.  Aerobic dancing.   What are some everyday activities that can help me to get exercise?  Yard work, such as: ? Pushing a lawn mower. ? Raking and bagging leaves.  Washing your car.  Pushing a stroller.  Shoveling snow.  Gardening.  Washing windows or floors. How can I be more active in my day-to-day activities?  Use stairs instead of an elevator.  Take a walk during your lunch break.  If you drive, park your car farther away from your work or school.  If you take public transportation, get off one stop early and walk the rest of the way.  Stand up or walk around during all of your indoor phone calls.  Get up, stretch, and walk around every 30 minutes throughout the day.  Enjoy exercise with a friend. Support to continue exercising will help you keep a regular routine of activity. What guidelines   can I follow while exercising?  Before you start a new exercise program, talk with your health care provider.  Do not exercise so much that you hurt yourself, feel dizzy, or get very short of breath.  Wear comfortable clothes and wear shoes with good support.  Drink plenty of  water while you exercise to prevent dehydration or heat stroke.  Work out until your breathing and your heartbeat get faster. Where to find more information  [place] Department of Health and Human Services: www.hhs.gov  Centers for Disease Control and Prevention (CDC): www.cdc.gov Summary  Exercising regularly is important. It will improve your overall fitness, flexibility, and endurance.  Regular exercise also will improve your overall health. It can help you control your weight, reduce stress, and improve your bone density.  Do not exercise so much that you hurt yourself, feel dizzy, or get very short of breath.  Before you start a new exercise program, talk with your health care provider. This information is not intended to replace advice given to you by your health care provider. Make sure you discuss any questions you have with your health care provider. Document Revised: 04/16/2017 Document Reviewed: 03/25/2017 Elsevier Patient Education  2021 Elsevier Inc.   Budget-Friendly Healthy Eating There are many ways to save money at the grocery store and continue to eat healthy. You can be successful if you:  Plan meals according to your budget.  Make a grocery list and only purchase food according to your grocery list.  Prepare food yourself at home. What are tips for following this plan? Reading food labels  Compare food labels between brand name foods and the store brand. Often the nutritional value is the same, but the store brand is lower cost.  Look for products that do not have added sugar, fat, or salt (sodium). These often cost the same but are healthier for you. Products may be labeled as: ? Sugar-free. ? Nonfat. ? Low-fat. ? Sodium-free. ? Low-sodium.  Look for lean ground beef labeled as at least 92% lean and 8% fat. Shopping  Buy only the items on your grocery list and go only to the areas of the store that have the items on your list.  Use coupons only for  foods and brands you normally buy. Avoid buying items you wouldn't normally buy simply because they are on sale.  Check online and in newspapers for weekly deals.  Buy healthy items from the bulk bins when available, such as herbs, spices, flour, pasta, nuts, and dried fruit.  Buy fruits and vegetables that are in season. Prices are usually lower on in-season produce.  Look at the unit price on the price tag. Use it to compare different brands and sizes to find out which item is the best deal.  Choose healthy items that are often low-cost, such as carrots, potatoes, apples, bananas, and oranges. Dried or canned beans are a low-cost protein source.  Buy in bulk and freeze extra food. Items you can buy in bulk include meats, fish, poultry, frozen fruits, and frozen vegetables.  Avoid buying "ready-to-eat" foods, such as pre-cut fruits and vegetables and pre-made salads.  If possible, shop around to discover where you can find the best prices. Consider other retailers such as dollar stores, larger wholesale stores, local fruit and vegetable stands, and farmers markets.  Do not shop when you are hungry. If you shop while hungry, it may be hard to stick to your list and budget.  Resist impulse buying. Use your grocery   list as your official plan for the week.  Buy a variety of vegetables and fruits by purchasing fresh, frozen, and canned items.  Look at the top and bottom shelves for deals. Foods at eye level (eye level of an adult or child) are usually more expensive.  Be efficient with your time when shopping. The more time you spend at the store, the more money you are likely to spend.  To save money when choosing more expensive foods like meats and dairy: ? Choose cheaper cuts of meat, such as bone-in chicken thighs and drumsticks instead of skinless and boneless chicken. When you are ready to prepare the chicken, you can remove the skin yourself to make it healthier. ? Choose lean meats  like chicken or turkey instead of beef. ? Choose canned seafood, such as tuna, salmon, or sardines. ? Buy eggs as a low-cost source of protein. ? Buy dried beans and peas, such as lentils, split peas, or kidney beans instead of meats. Dried beans and peas are a good alternative source of protein. ? Buy the larger tubs of yogurt instead of individual-sized containers.  Choose water instead of sodas and other sweetened beverages.  Avoid buying chips, cookies, and other "junk food." These items are usually expensive and not healthy.   Cooking  Make extra food and freeze the extras in meal-sized containers or in individual portions for fast meals and snacks.  Pre-cook on days when you have extra time to prepare meals in advance. You can keep these meals in the fridge or freezer and reheat for a quick meal.  When you come home from the grocery store, wash, peel, and cut fruits and vegetables so they are ready to use and eat. This will help reduce food waste. Meal planning  Do not eat out or get fast food. Prepare food at home.  Make a grocery list and make sure to bring it with you to the store. If you have a smart phone, you could use your phone to create your shopping list.  Plan meals and snacks according to a grocery list and budget you create.  Use leftovers in your meal plan for the week.  Look for recipes where you can cook once and make enough food for two meals.  Prepare budget-friendly types of meals like stews, casseroles, and stir-fry dishes.  Try some meatless meals or try "no cook" meals like salads.  Make sure that half your plate is filled with fruits or vegetables. Choose from fresh, frozen, or canned fruits and vegetables. If eating canned, remember to rinse them before eating. This will remove any excess salt added for packaging. Summary  Eating healthy on a budget is possible if you plan your meals according to your budget, purchase according to your budget and  grocery list, and prepare food yourself.  Tips for buying more food on a limited budget include buying generic brands, using coupons only for foods you normally buy, and buying healthy items from the bulk bins when available.  Tips for buying cheaper food to replace expensive food include choosing cheaper, lean cuts of meat, and buying dried beans and peas. This information is not intended to replace advice given to you by your health care provider. Make sure you discuss any questions you have with your health care provider. Document Revised: 02/15/2020 Document Reviewed: 02/15/2020 Elsevier Patient Education  2021 [employer] Inc.   Bone Health Bones protect organs, store calcium, anchor muscles, and support the whole body. Keeping your bones   strong is important, especially as you get older. You can take actions to help keep your bones strong and healthy. Why is keeping my bones healthy important? Keeping your bones healthy is important because your body constantly replaces bone cells. Cells get old, and new cells take their place. As we age, we lose bone cells because the body may not be able to make enough new cells to replace the old cells. The amount of bone cells and bone tissue you have is referred to as bone mass. The higher your bone mass, the stronger your bones. The aging process leads to an overall loss of bone mass in the body, which can increase the likelihood of:  Joint pain and stiffness.  Broken bones.  A condition in which the bones become weak and brittle (osteoporosis). A large decline in bone mass occurs in older adults. In women, it occurs about the time of menopause.   What actions can I take to keep my bones healthy? Good health habits are important for maintaining healthy bones. This includes eating nutritious foods and exercising regularly. To have healthy bones, you need to get enough of the right minerals and vitamins. Most nutrition experts recommend getting these  nutrients from the foods that you eat. In some cases, taking supplements may also be recommended. Doing certain types of exercise is also important for bone health. What are the nutritional recommendations for healthy bones? Eating a well-balanced diet with plenty of calcium and vitamin D will help to protect your bones. Nutritional recommendations vary from person to person. Ask your health care provider what is healthy for you. Here are some general guidelines. Get enough calcium Calcium is the most important (essential) mineral for bone health. Most people can get enough calcium from their diet, but supplements may be recommended for people who are at risk for osteoporosis. Good sources of calcium include:  Dairy products, such as low-fat or nonfat milk, cheese, and yogurt.  Dark green leafy vegetables, such as bok choy and broccoli.  Calcium-fortified foods, such as orange juice, cereal, bread, soy beverages, and tofu products.  Nuts, such as almonds. Follow these recommended amounts for daily calcium intake:  Children, age 1-3: 700 mg.  Children, age 4-8: 1,000 mg.  Children, age 9-13: 1,300 mg.  Teens, age 14-18: 1,300 mg.  Adults, age 19-50: 1,000 mg.  Adults, age 51-70: ? Men: 1,000 mg. ? Women: 1,200 mg.  Adults, age 71 or older: 1,200 mg.  Pregnant and breastfeeding females: ? Teens: 1,300 mg. ? Adults: 1,000 mg. Get enough vitamin D Vitamin D is the most essential vitamin for bone health. It helps the body absorb calcium. Sunlight stimulates the skin to make vitamin D, so be sure to get enough sunlight. If you live in a cold climate or you do not get outside often, your health care provider may recommend that you take vitamin D supplements. Good sources of vitamin D in your diet include:  Egg yolks.  Saltwater fish.  Milk and cereal fortified with vitamin D. Follow these recommended amounts for daily vitamin D intake:  Children and teens, age 1-18: 600  international units.  Adults, age 50 or younger: 400-800 international units.  Adults, age 51 or older: 800-1,000 international units. Get other important nutrients Other nutrients that are important for bone health include:  Phosphorus. This mineral is found in meat, poultry, dairy foods, nuts, and legumes. The recommended daily intake for adult men and adult women is 700 mg.  Magnesium. This mineral   is found in seeds, nuts, dark green vegetables, and legumes. The recommended daily intake for adult men is 400-420 mg. For adult women, it is 310-320 mg.  Vitamin K. This vitamin is found in green leafy vegetables. The recommended daily intake is 120 mg for adult men and 90 mg for adult women.   What type of physical activity is best for building and maintaining healthy bones? Weight-bearing and strength-building activities are important for building and maintaining healthy bones. Weight-bearing activities cause muscles and bones to work against gravity. Strength-building activities increase the strength of the muscles that support bones. Weight-bearing and muscle-building activities include:  Walking and hiking.  Jogging and running.  Dancing.  Gym exercises.  Lifting weights.  Tennis and racquetball.  Climbing stairs.  Aerobics. Adults should get at least 30 minutes of moderate physical activity on most days. Children should get at least 60 minutes of moderate physical activity on most days. Ask your health care provider what type of exercise is best for you.   How can I find out if my bone mass is low? Bone mass can be measured with an X-ray test called a bone mineral density (BMD) test. This test is recommended for all women who are age 65 or older. It may also be recommended for:  Men who are age 70 or older.  People who are at risk for osteoporosis because of: ? Having bones that break easily. ? Having a long-term disease that weakens bones, such as kidney disease or  rheumatoid arthritis. ? Having menopause earlier than normal. ? Taking medicine that weakens bones, such as steroids, thyroid hormones, or hormone treatment for breast cancer or prostate cancer. ? Smoking. ? Drinking three or more alcoholic drinks a day. If you find that you have a low bone mass, you may be able to prevent osteoporosis or further bone loss by changing your diet and lifestyle. Where can I find more information? For more information, check out the following websites:  National Osteoporosis Foundation: www.nof.org/patients  National Institutes of Health: www.bones.nih.gov  International Osteoporosis Foundation: www.iofbonehealth.org Summary  The aging process leads to an overall loss of bone mass in the body, which can increase the likelihood of broken bones and osteoporosis.  Eating a well-balanced diet with plenty of calcium and vitamin D will help to protect your bones.  Weight-bearing and strength-building activities are also important for building and maintaining strong bones.  Bone mass can be measured with an X-ray test called a bone mineral density (BMD) test. This information is not intended to replace advice given to you by your health care provider. Make sure you discuss any questions you have with your health care provider. Document Revised: 05/31/2017 Document Reviewed: 05/31/2017 Elsevier Patient Education  2021 [employer].   

## 2020-09-12 NOTE — Progress Notes (Signed)
Gynecology Annual Exam  PCP: Dion Body, MD  Chief Complaint:  Chief Complaint  Patient presents with  . Annual Exam    History of Present Illness: Patient is a 40 y.o. G3P3003 presents for annual exam. The patient has no complaints today.   LMP: Patient's last menstrual period was 08/14/2020. Average Interval: every 3 montrhs Duration of flow: 5 days Heavy Menses: no Dysmenorrhea: no  She denies passage of large clots She reports sensations of gushing or flooding of blood. She denies accidents where she bleeds through her clothing. She denies that she changes a saturated pad or tampon more frequently than every hour.  She denies that pain from her periods limits her activities.  The patient does perform self breast exams.  There is no notable family history of breast or ovarian cancer in her family.  The patient reports her exercise generally consists of boot camp and walking 2-3 days a week.  The patient denies current symptoms of depression.   Review of Systems: Review of Systems  Constitutional: Negative for chills, fever, malaise/fatigue and weight loss.  HENT: Negative for congestion, hearing loss and sinus pain.   Eyes: Negative for blurred vision and double vision.  Respiratory: Negative for cough, sputum production, shortness of breath and wheezing.   Cardiovascular: Negative for chest pain, palpitations, orthopnea and leg swelling.  Gastrointestinal: Negative for abdominal pain, constipation, diarrhea, nausea and vomiting.  Genitourinary: Negative for dysuria, flank pain, frequency, hematuria and urgency.  Musculoskeletal: Negative for back pain, falls and joint pain.  Skin: Negative for itching and rash.  Neurological: Negative for dizziness and headaches.  Psychiatric/Behavioral: Negative for depression, substance abuse and suicidal ideas. The patient is not nervous/anxious.     Past Medical History:  Past Medical History:  Diagnosis Date  .  GERD (gastroesophageal reflux disease)    during pregnancy  . PONV (postoperative nausea and vomiting)    nausea with laparoscopy    Past Surgical History:  Past Surgical History:  Procedure Laterality Date  . CESAREAN SECTION  02/2014  . CESAREAN SECTION N/A 12/19/2015   Procedure: CESAREAN SECTION;  Surgeon: Malachy Mood, MD;  Location: [facility];  Service: Obstetrics;  Laterality: N/A;  Time of Birth [birth year] Sex: female Weight: 8 lb 2 oz  . CESAREAN SECTION  02/03/2018  . CESAREAN SECTION WITH BILATERAL TUBAL LIGATION N/A 02/03/2018   Procedure: CESAREAN SECTION WITH BILATERAL TUBAL LIGATION;  Surgeon: Malachy Mood, MD;  Location: [facility];  Service: Obstetrics;  Laterality: N/A;  . diagnoastic lap  2013  . DILATION AND CURETTAGE OF UTERUS    . endometriosis     history of  . EVALUATION UNDER ANESTHESIA WITH HEMORRHOIDECTOMY N/A 02/20/2018   Procedure: EXAM UNDER ANESTHESIA WITH HEMORRHOIDECTOMY;  Surgeon: Jules Husbands, MD;  Location: [facility] OR[facility];  Service: General;  Laterality: N/A;  . HERNIA REPAIR Right 1984  . INTRAUTERINE DEVICE (IUD) INSERTION  2015  . IUD REMOVAL  2016  . TUBAL LIGATION  02/03/2018    Gynecologic History:  Patient's last menstrual period was 08/14/2020. Menarche: 13  History of fibroids, polyps, or ovarian cysts? : history of polyps  History of PCOS? no Hstory of Endometriosis? yes History of abnormal pap smears? no Have you had any sexually transmitted infections in the past? no  She denies HPV vaccination in the past. She declines today.  Last Pap: Results were: NIL HPV negative   She identifies as a female. She is sexually active with men.   She  denies dyspareunia. She denies postcoital bleeding.  She currently uses OCP (estrogen/progesterone) and tubal ligation for contraception.    Obstetric History: [id number]  Family History:  Family History  Problem Relation Age of Onset  . Thyroid disease Mother   . Hyperlipidemia Father   .  Squamous cell carcinoma Father     Social History:  Social History   Socioeconomic History  . Marital status: Married    Spouse name: Not on file  . Number of children: Not on file  . Years of education: Not on file  . Highest education level: Not on file  Occupational History  . Occupation: Therapist, sports -OR    Employer: [employer]  Tobacco Use  . Smoking status: Never Smoker  . Smokeless tobacco: Never Used  Vaping Use  . Vaping Use: Never used  Substance and Sexual Activity  . Alcohol use: No  . Drug use: No  . Sexual activity: Yes    Birth control/protection: Surgical    Comment: Tubal ligation  Other Topics Concern  . Not on file  Social History Narrative  . Not on file   Social Determinants of Health   Financial Resource Strain: Not on file  Food Insecurity: Not on file  Transportation Needs: Not on file  Physical Activity: Not on file  Stress: Not on file  Social Connections: Not on file  Intimate Partner Violence: Not on file    Allergies:  Allergies  Allergen Reactions  . Zantac [Ranitidine Hcl] Anaphylaxis  . Penicillins Rash    Has patient had a PCN reaction causing immediate rash, facial/tongue/throat swelling, SOB or lightheadedness with hypotension: Yes Has patient had a PCN reaction causing severe rash involving mucus membranes or skin necrosis: Yes Has patient had a PCN reaction that required hospitalization No Has patient had a PCN reaction occurring within the last 10 years: No If all of the above answers are "NO", then may proceed with Cephalosporin use.   . Procardia [Nifedipine] Rash    Medications: Prior to Admission medications   Medication Sig Start Date End Date Taking? Authorizing Provider  Levonorgestrel-Ethinyl Estradiol (AMETHIA) 0.15-0.03 &0.01 MG tablet TAKE 1 TABLET BY MOUTH DAILY. 09/12/20 09/12/21  Homero Fellers, MD    Physical Exam Vitals: Blood pressure 100/60, height 5' (1.524 m), weight 140 lb (63.5 kg), last menstrual  period 08/14/2020.  Physical Exam Constitutional:      Appearance: She is well-developed.  Genitourinary:     Genitourinary Comments: External: Normal appearing vulva. No lesions noted.  Speculum examination: Normal appearing cervix. No blood in the vaginal vault. no discharge.   Bimanual examination: Uterus midline, non-tender, normal in size, shape and contour.  No CMT. No adnexal masses. No adnexal tenderness. Pelvis not fixed.  Breast Exam: breast equal without skin changes, nipple discharge, breast lump or enlarged lymph nodes   HENT:     Head: Normocephalic and atraumatic.  Neck:     Thyroid: No thyromegaly.  Cardiovascular:     Rate and Rhythm: Normal rate and regular rhythm.     Heart sounds: Normal heart sounds.  Pulmonary:     Effort: Pulmonary effort is normal.     Breath sounds: Normal breath sounds.  Abdominal:     General: Bowel sounds are normal. There is no distension.     Palpations: Abdomen is soft. There is no mass.  Musculoskeletal:     Cervical back: Neck supple.  Neurological:     Mental Status: She is alert and oriented to person,  place, and time.  Skin:    General: Skin is warm and dry.  Psychiatric:        Behavior: Behavior normal.        Thought Content: Thought content normal.        Judgment: Judgment normal.  Vitals reviewed.      Female chaperone present for pelvic and breast  portions of the physical exam  Assessment: 40 y.o. G3P3003 routine annual exam  Plan: Problem List Items Addressed This Visit   None   Visit Diagnoses    Encounter for birth control pills maintenance    -  Primary   Encounter for gynecological examination without abnormal finding       Encounter for screening breast examination       Screening for thyroid disorder       Relevant Orders   TSH + free T4   T3, free   Low TSH level       Relevant Orders   TSH + free T4   T3, free   Fatigue, unspecified type       Relevant Orders   TSH + free T4   T3, free    Encounter for BCP (birth control pills) initial prescription       Relevant Medications   Levonorgestrel-Ethinyl Estradiol (AMETHIA) 0.15-0.03 &0.01 MG tablet      1) STI screening was offered and declined  2) ASCCP guidelines and rational discussed.  Patient opts for every 5 years screening interval  3) Contraception - continue with extended cycle OCP.  4) Routine healthcare maintenance including cholesterol, diabetes screening discussed. Plan to repeat when 32. TSH was low before, will recheck thyroid labs today.   5) Follow up in 1 year for annual  Adrian Prows MD, [place], [facility] 09/12/2020 10:15 AM

## 2020-09-13 LAB — T3, FREE: T3, Free: 2.9 pg/mL (ref 2.0–4.4)

## 2020-09-13 LAB — TSH+FREE T4
Free T4: 0.98 ng/dL (ref 0.82–1.77)
TSH: 0.412 u[IU]/mL — ABNORMAL LOW (ref 0.450–4.500)

## 2020-11-27 ENCOUNTER — Other Ambulatory Visit: Payer: Self-pay

## 2021-03-11 ENCOUNTER — Other Ambulatory Visit: Payer: Self-pay

## 2021-03-14 DIAGNOSIS — D2272 Melanocytic nevi of left lower limb, including hip: Secondary | ICD-10-CM | POA: Diagnosis not present

## 2021-03-14 DIAGNOSIS — D225 Melanocytic nevi of trunk: Secondary | ICD-10-CM | POA: Diagnosis not present

## 2021-03-14 DIAGNOSIS — D2262 Melanocytic nevi of left upper limb, including shoulder: Secondary | ICD-10-CM | POA: Diagnosis not present

## 2021-03-14 DIAGNOSIS — D2271 Melanocytic nevi of right lower limb, including hip: Secondary | ICD-10-CM | POA: Diagnosis not present

## 2021-03-14 DIAGNOSIS — L988 Other specified disorders of the skin and subcutaneous tissue: Secondary | ICD-10-CM | POA: Diagnosis not present

## 2021-03-14 DIAGNOSIS — D2261 Melanocytic nevi of right upper limb, including shoulder: Secondary | ICD-10-CM | POA: Diagnosis not present

## 2021-03-14 DIAGNOSIS — L738 Other specified follicular disorders: Secondary | ICD-10-CM | POA: Diagnosis not present

## 2021-06-10 ENCOUNTER — Other Ambulatory Visit: Payer: Self-pay

## 2021-07-30 DIAGNOSIS — H5213 Myopia, bilateral: Secondary | ICD-10-CM | POA: Diagnosis not present

## 2021-07-30 DIAGNOSIS — H52223 Regular astigmatism, bilateral: Secondary | ICD-10-CM | POA: Diagnosis not present

## 2021-07-30 DIAGNOSIS — H04123 Dry eye syndrome of bilateral lacrimal glands: Secondary | ICD-10-CM | POA: Diagnosis not present

## 2021-08-05 ENCOUNTER — Ambulatory Visit: Payer: 59 | Admitting: Dermatology

## 2021-08-28 ENCOUNTER — Other Ambulatory Visit: Payer: Self-pay

## 2021-09-02 DIAGNOSIS — Z13220 Encounter for screening for lipoid disorders: Secondary | ICD-10-CM | POA: Diagnosis not present

## 2021-09-02 DIAGNOSIS — Z0000 Encounter for general adult medical examination without abnormal findings: Secondary | ICD-10-CM | POA: Diagnosis not present

## 2021-09-02 DIAGNOSIS — Z136 Encounter for screening for cardiovascular disorders: Secondary | ICD-10-CM | POA: Diagnosis not present

## 2021-09-02 DIAGNOSIS — Z131 Encounter for screening for diabetes mellitus: Secondary | ICD-10-CM | POA: Diagnosis not present

## 2021-09-09 DIAGNOSIS — Z0000 Encounter for general adult medical examination without abnormal findings: Secondary | ICD-10-CM | POA: Diagnosis not present

## 2021-09-18 ENCOUNTER — Other Ambulatory Visit: Payer: Self-pay

## 2021-09-18 DIAGNOSIS — F419 Anxiety disorder, unspecified: Secondary | ICD-10-CM | POA: Insufficient documentation

## 2021-09-18 DIAGNOSIS — F32A Depression, unspecified: Secondary | ICD-10-CM | POA: Diagnosis not present

## 2021-09-18 MED ORDER — ESCITALOPRAM OXALATE 5 MG PO TABS
ORAL_TABLET | ORAL | 1 refills | Status: DC
  Filled 2021-09-18: qty 30, 30d supply, fill #0
  Filled 2021-10-14: qty 30, 30d supply, fill #1

## 2021-10-14 ENCOUNTER — Other Ambulatory Visit: Payer: Self-pay

## 2021-11-11 ENCOUNTER — Other Ambulatory Visit: Payer: Self-pay

## 2021-11-11 DIAGNOSIS — F419 Anxiety disorder, unspecified: Secondary | ICD-10-CM | POA: Diagnosis not present

## 2021-11-11 DIAGNOSIS — F32A Depression, unspecified: Secondary | ICD-10-CM | POA: Diagnosis not present

## 2021-11-11 MED ORDER — ESCITALOPRAM OXALATE 10 MG PO TABS
10.0000 mg | ORAL_TABLET | Freq: Every day | ORAL | 1 refills | Status: DC
  Filled 2021-11-11: qty 90, 90d supply, fill #0
  Filled 2022-02-09: qty 90, 90d supply, fill #1

## 2021-11-25 ENCOUNTER — Other Ambulatory Visit: Payer: Self-pay

## 2021-11-25 DIAGNOSIS — Z01419 Encounter for gynecological examination (general) (routine) without abnormal findings: Secondary | ICD-10-CM | POA: Diagnosis not present

## 2021-11-25 DIAGNOSIS — Z3041 Encounter for surveillance of contraceptive pills: Secondary | ICD-10-CM | POA: Diagnosis not present

## 2021-11-25 DIAGNOSIS — Z1339 Encounter for screening examination for other mental health and behavioral disorders: Secondary | ICD-10-CM | POA: Diagnosis not present

## 2021-11-25 DIAGNOSIS — Z1331 Encounter for screening for depression: Secondary | ICD-10-CM | POA: Diagnosis not present

## 2021-11-25 MED ORDER — ASHLYNA 0.15-0.03 &0.01 MG PO TABS
1.0000 | ORAL_TABLET | Freq: Every day | ORAL | 4 refills | Status: DC
  Filled 2021-11-25: qty 91, 91d supply, fill #0
  Filled 2022-03-07: qty 91, 91d supply, fill #1
  Filled 2022-06-06: qty 91, 91d supply, fill #2
  Filled 2022-09-08: qty 91, 91d supply, fill #3

## 2021-11-26 ENCOUNTER — Other Ambulatory Visit: Payer: Self-pay

## 2022-02-09 ENCOUNTER — Other Ambulatory Visit: Payer: Self-pay

## 2022-02-11 ENCOUNTER — Other Ambulatory Visit: Payer: Self-pay

## 2022-02-11 DIAGNOSIS — F419 Anxiety disorder, unspecified: Secondary | ICD-10-CM | POA: Diagnosis not present

## 2022-02-11 DIAGNOSIS — F32A Depression, unspecified: Secondary | ICD-10-CM | POA: Diagnosis not present

## 2022-02-11 MED ORDER — ESCITALOPRAM OXALATE 10 MG PO TABS
10.0000 mg | ORAL_TABLET | Freq: Every day | ORAL | 1 refills | Status: DC
  Filled 2022-02-11 – 2022-05-09 (×2): qty 90, 90d supply, fill #0
  Filled 2022-08-07: qty 90, 90d supply, fill #1

## 2022-02-18 ENCOUNTER — Ambulatory Visit (INDEPENDENT_AMBULATORY_CARE_PROVIDER_SITE_OTHER): Payer: 59

## 2022-02-18 ENCOUNTER — Encounter: Payer: Self-pay | Admitting: Podiatry

## 2022-02-18 ENCOUNTER — Ambulatory Visit: Payer: 59 | Admitting: Podiatry

## 2022-02-18 DIAGNOSIS — M778 Other enthesopathies, not elsewhere classified: Secondary | ICD-10-CM

## 2022-02-18 NOTE — Progress Notes (Signed)
Subjective:  Patient ID: Tiffany [name] Section, female    DOB: [birth year],  MRN: [id number] HPI Chief Complaint  Patient presents with   Foot Pain    1st MPJ left - stepped wrong at work 6 months ago and felt a sudden burning sensation, has been burning intermittently since, now right has started some as well, shoes aggravate, stopped working out and that has helped some   New Patient (Initial Visit)    41 y.o. female presents with the above complaint.   ROS: Denies fever chills nausea vomiting muscle aches pains calf pain back pain chest pain shortness of breath.  Past Medical History:  Diagnosis Date   GERD (gastroesophageal reflux disease)    during pregnancy   PONV (postoperative nausea and vomiting)    nausea with laparoscopy   Past Surgical History:  Procedure Laterality Date   CESAREAN SECTION  02/2014   CESAREAN SECTION N/A 12/19/2015   Procedure: CESAREAN SECTION;  Surgeon: Malachy Mood, MD;  Location: [facility];  Service: Obstetrics;  Laterality: N/A;  Time of Birth 08:[birth year] Sex: female Weight: 8 lb 2 oz   CESAREAN SECTION  02/03/2018   CESAREAN SECTION WITH BILATERAL TUBAL LIGATION N/A 02/03/2018   Procedure: CESAREAN SECTION WITH BILATERAL TUBAL LIGATION;  Surgeon: Malachy Mood, MD;  Location: [facility];  Service: Obstetrics;  Laterality: N/A;   diagnoastic lap  2013   DILATION AND CURETTAGE OF UTERUS     endometriosis     history of   EVALUATION UNDER ANESTHESIA WITH HEMORRHOIDECTOMY N/A 02/20/2018   Procedure: EXAM UNDER ANESTHESIA WITH HEMORRHOIDECTOMY;  Surgeon: Jules Husbands, MD;  Location: [facility];  Service: General;  Laterality: N/A;   HERNIA REPAIR Right 1984   INTRAUTERINE DEVICE (IUD) INSERTION  2015   IUD REMOVAL  2016   TUBAL LIGATION  02/03/2018    Current Outpatient Medications:    ASHLYNA 0.15-0.03 &0.01 MG tablet, Take 1 tablet by mouth once daily, Disp: 91 tablet, Rfl: 4   escitalopram (LEXAPRO) 10 MG tablet, Take 1 tablet (10 mg total) by mouth  once daily, Disp: 90 tablet, Rfl: 1   Multiple Vitamin (MULTI-VITAMIN) tablet, Take 1 tablet by mouth daily., Disp: , Rfl:   Allergies  Allergen Reactions   Zantac [Ranitidine Hcl] Anaphylaxis   Penicillins Rash    Has patient had a PCN reaction causing immediate rash, facial/tongue/throat swelling, SOB or lightheadedness with hypotension: Yes Has patient had a PCN reaction causing severe rash involving mucus membranes or skin necrosis: Yes Has patient had a PCN reaction that required hospitalization No Has patient had a PCN reaction occurring within the last 10 years: No If all of the above answers are "NO", then may proceed with Cephalosporin use.    Procardia [Nifedipine] Rash   Review of Systems Objective:  There were no vitals filed for this visit.  General: Well developed, nourished, in no acute distress, alert and oriented x3   Dermatological: Skin is warm, dry and supple bilateral. Nails x 10 are well maintained; remaining integument appears unremarkable at this time. There are no open sores, no preulcerative lesions, no rash or signs of infection present.  Vascular: Dorsalis Pedis artery and Posterior Tibial artery pedal pulses are 2/4 bilateral with immedate capillary fill time. Pedal hair growth present. No varicosities and no lower extremity edema present bilateral.   Neruologic: Grossly intact via light touch bilateral. Vibratory intact via tuning fork bilateral. Protective threshold with Semmes Wienstein monofilament intact to all pedal sites bilateral. Patellar and Achilles  deep tendon reflexes 2+ bilateral. No Babinski or clonus noted bilateral.   Musculoskeletal: No gross boney pedal deformities bilateral. No pain, crepitus, or limitation noted with foot and ankle range of motion bilateral. Muscular strength 5/5 in all groups tested bilateral.  She has a mild to moderate hallux limitus of the first metatarsophalangeal joint of the left foot.  Tenderness on palpation of the  joint upon distraction and of the medial dorsal cutaneous nerve as it courses over a small hypertrophic medial condyle of the head of the first metatarsal.  There is a palpable ridge transversing the head of the first metatarsal distally most likely osteoarthritis.  Gait: Unassisted, Nonantalgic.    Radiographs:  Radiographs taken today demonstrate an osseously mature foot she has an elevated first metatarsal with early spur formation to the dorsal aspect at the head.  Early joint space narrowing to the medial aspect of the joint.  Assessment & Plan:   Assessment: Capsulitis neuritis first metatarsophalangeal joint.  Plan: Discussed etiology pathology conservative surgical therapies offered her an injection she declined stating that she would like to wait until it becomes more painful.  We did discuss appropriate shoe gear including a more firm soled shoe.  She understands that certain activities with full extension of the joint will cause more pain requesting her to try to avoid those activities.  Also discussed topical anti-inflammatories.  She will follow-up with [place] as needed.     Ulice Follett T. [place], [place]

## 2022-03-09 ENCOUNTER — Other Ambulatory Visit: Payer: Self-pay

## 2022-04-15 ENCOUNTER — Other Ambulatory Visit: Payer: Self-pay

## 2022-04-15 DIAGNOSIS — D2261 Melanocytic nevi of right upper limb, including shoulder: Secondary | ICD-10-CM | POA: Diagnosis not present

## 2022-04-15 DIAGNOSIS — D2271 Melanocytic nevi of right lower limb, including hip: Secondary | ICD-10-CM | POA: Diagnosis not present

## 2022-04-15 DIAGNOSIS — D225 Melanocytic nevi of trunk: Secondary | ICD-10-CM | POA: Diagnosis not present

## 2022-04-15 DIAGNOSIS — D2272 Melanocytic nevi of left lower limb, including hip: Secondary | ICD-10-CM | POA: Diagnosis not present

## 2022-04-15 DIAGNOSIS — L700 Acne vulgaris: Secondary | ICD-10-CM | POA: Diagnosis not present

## 2022-04-15 DIAGNOSIS — L578 Other skin changes due to chronic exposure to nonionizing radiation: Secondary | ICD-10-CM | POA: Diagnosis not present

## 2022-04-15 DIAGNOSIS — D2262 Melanocytic nevi of left upper limb, including shoulder: Secondary | ICD-10-CM | POA: Diagnosis not present

## 2022-04-15 MED ORDER — TRETINOIN 0.025 % EX CREA
TOPICAL_CREAM | CUTANEOUS | 3 refills | Status: AC
  Filled 2022-04-15: qty 20, 30d supply, fill #0
  Filled 2022-06-06: qty 20, 30d supply, fill #1

## 2022-05-11 ENCOUNTER — Other Ambulatory Visit: Payer: Self-pay

## 2022-06-08 ENCOUNTER — Other Ambulatory Visit: Payer: Self-pay

## 2022-08-09 ENCOUNTER — Other Ambulatory Visit: Payer: Self-pay

## 2022-08-12 ENCOUNTER — Other Ambulatory Visit: Payer: Self-pay

## 2022-08-12 DIAGNOSIS — F32A Depression, unspecified: Secondary | ICD-10-CM | POA: Diagnosis not present

## 2022-08-12 DIAGNOSIS — F419 Anxiety disorder, unspecified: Secondary | ICD-10-CM | POA: Diagnosis not present

## 2022-08-12 MED ORDER — ESCITALOPRAM OXALATE 10 MG PO TABS
ORAL_TABLET | ORAL | 1 refills | Status: DC
  Filled 2022-08-12 (×2): qty 135, 90d supply, fill #0
  Filled 2022-11-06: qty 135, 90d supply, fill #1

## 2022-09-24 DIAGNOSIS — Z136 Encounter for screening for cardiovascular disorders: Secondary | ICD-10-CM | POA: Diagnosis not present

## 2022-09-24 DIAGNOSIS — Z131 Encounter for screening for diabetes mellitus: Secondary | ICD-10-CM | POA: Diagnosis not present

## 2022-09-24 DIAGNOSIS — Z1329 Encounter for screening for other suspected endocrine disorder: Secondary | ICD-10-CM | POA: Diagnosis not present

## 2022-09-24 DIAGNOSIS — Z13220 Encounter for screening for lipoid disorders: Secondary | ICD-10-CM | POA: Diagnosis not present

## 2022-09-24 DIAGNOSIS — Z0000 Encounter for general adult medical examination without abnormal findings: Secondary | ICD-10-CM | POA: Diagnosis not present

## 2022-09-28 DIAGNOSIS — Z0000 Encounter for general adult medical examination without abnormal findings: Secondary | ICD-10-CM | POA: Diagnosis not present

## 2022-09-28 DIAGNOSIS — F32A Depression, unspecified: Secondary | ICD-10-CM | POA: Diagnosis not present

## 2022-09-28 DIAGNOSIS — F419 Anxiety disorder, unspecified: Secondary | ICD-10-CM | POA: Diagnosis not present

## 2022-11-12 ENCOUNTER — Other Ambulatory Visit: Payer: Self-pay

## 2022-11-30 ENCOUNTER — Other Ambulatory Visit: Payer: Self-pay

## 2022-11-30 DIAGNOSIS — Z124 Encounter for screening for malignant neoplasm of cervix: Secondary | ICD-10-CM | POA: Diagnosis not present

## 2022-11-30 MED ORDER — LEVONORGEST-ETH ESTRAD 91-DAY 0.15-0.03 &0.01 MG PO TABS
1.0000 | ORAL_TABLET | Freq: Every day | ORAL | 4 refills | Status: DC
  Filled 2022-11-30: qty 91, 91d supply, fill #0

## 2022-12-01 ENCOUNTER — Other Ambulatory Visit: Payer: Self-pay | Admitting: Obstetrics and Gynecology

## 2022-12-01 DIAGNOSIS — Z1231 Encounter for screening mammogram for malignant neoplasm of breast: Secondary | ICD-10-CM

## 2022-12-17 ENCOUNTER — Other Ambulatory Visit: Payer: Self-pay

## 2022-12-18 ENCOUNTER — Ambulatory Visit: Admission: RE | Admit: 2022-12-18 | Payer: Commercial Managed Care - PPO | Source: Ambulatory Visit

## 2022-12-18 DIAGNOSIS — Z1231 Encounter for screening mammogram for malignant neoplasm of breast: Secondary | ICD-10-CM | POA: Diagnosis not present

## 2022-12-22 ENCOUNTER — Other Ambulatory Visit: Payer: Self-pay | Admitting: Obstetrics and Gynecology

## 2022-12-22 DIAGNOSIS — R928 Other abnormal and inconclusive findings on diagnostic imaging of breast: Secondary | ICD-10-CM

## 2022-12-22 DIAGNOSIS — N63 Unspecified lump in unspecified breast: Secondary | ICD-10-CM

## 2022-12-23 ENCOUNTER — Ambulatory Visit
Admission: RE | Admit: 2022-12-23 | Discharge: 2022-12-23 | Disposition: A | Discharge status: Home / Self Care | Payer: Commercial Managed Care - PPO | Source: Ambulatory Visit | Attending: Obstetrics and Gynecology | Admitting: Obstetrics and Gynecology

## 2022-12-23 DIAGNOSIS — R928 Other abnormal and inconclusive findings on diagnostic imaging of breast: Secondary | ICD-10-CM

## 2022-12-23 DIAGNOSIS — N63 Unspecified lump in unspecified breast: Secondary | ICD-10-CM

## 2022-12-23 DIAGNOSIS — R92321 Mammographic fibroglandular density, right breast: Secondary | ICD-10-CM | POA: Diagnosis not present

## 2023-02-03 ENCOUNTER — Other Ambulatory Visit: Payer: Self-pay

## 2023-02-03 MED ORDER — ESCITALOPRAM OXALATE 20 MG PO TABS
20.0000 mg | ORAL_TABLET | Freq: Every day | ORAL | 1 refills | Status: DC
  Filled 2023-02-03: qty 90, 90d supply, fill #0
  Filled 2023-05-13: qty 90, 90d supply, fill #1
  Filled 2023-08-09: qty 90, 90d supply, fill #2

## 2023-03-25 ENCOUNTER — Ambulatory Visit: Payer: Commercial Managed Care - PPO | Admitting: Podiatry

## 2023-03-25 ENCOUNTER — Encounter: Payer: Self-pay | Admitting: Podiatry

## 2023-03-25 DIAGNOSIS — M7752 Other enthesopathy of left foot: Secondary | ICD-10-CM | POA: Diagnosis not present

## 2023-03-25 DIAGNOSIS — Q666 Other congenital valgus deformities of feet: Secondary | ICD-10-CM

## 2023-03-25 DIAGNOSIS — L600 Ingrowing nail: Secondary | ICD-10-CM

## 2023-03-25 NOTE — Progress Notes (Signed)
Subjective:  Patient ID: Tiffany [name], female    DOB: 1980/06/08,  MRN: [id number]  Chief Complaint  Patient presents with   Foot Pain    "They hurt off and on."    42 y.o. female presents with the above complaint.  Patient presents with primary complaint left first metatarsophalangeal joint pain with end range of motion pain.  It occasionally hurts has progressive gotten a little bit worse.  Today her pain is very minimal.  She also has complaint bilateral hallux lateral border ingrown.  She states that been hurting as well.  She would like to have it removed pain scale 7 out of 10 dull aching nature.  Certain type of shoes make it worse.  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 Denies N/V/F/Ch.  Past Medical History:  Diagnosis Date   GERD (gastroesophageal reflux disease)    during pregnancy   PONV (postoperative nausea and vomiting)    nausea with laparoscopy    Current Outpatient Medications:    escitalopram (LEXAPRO) 20 MG tablet, Take 1 tablet (20 mg total) by mouth daily., Disp: 100 tablet, Rfl: 1   Multiple Vitamin (MULTI-VITAMIN) tablet, Take 1 tablet by mouth daily., Disp: , Rfl:    tretinoin (RETIN-A) 0.025 % cream, Apply a pea sized amount to face at bedtime. Slowly build up to nightly use, Disp: 20 g, Rfl: 3   Levonorgestrel-Ethinyl Estradiol (ASHLYNA) 0.15-0.03 &0.01 MG tablet, Take 1 tablet by mouth once daily, Disp: 91 tablet, Rfl: 4  Social History   Tobacco Use  Smoking Status Never  Smokeless Tobacco Never    Allergies  Allergen Reactions   Zantac [Ranitidine Hcl] Anaphylaxis   Penicillins Rash    Has patient had a PCN reaction causing immediate rash, facial/tongue/throat swelling, SOB or lightheadedness with hypotension: Yes Has patient had a PCN reaction causing severe rash involving mucus membranes or skin necrosis: Yes Has patient had a PCN reaction that required  hospitalization No Has patient had a PCN reaction occurring within the last 10 years: No If all of the above answers are "NO", then may proceed with Cephalosporin use.    Procardia [Nifedipine] Rash   Objective:  There were no vitals filed for this visit. There is no height or weight on file to calculate BMI. Constitutional Well developed. Well nourished.  Vascular Dorsalis pedis pulses palpable bilaterally. Posterior tibial pulses palpable bilaterally. Capillary refill normal to all digits.  No cyanosis or clubbing noted. Pedal hair growth normal.  Neurologic Normal speech. Oriented to person, place, and time. Epicritic sensation to light touch grossly present bilaterally.  Dermatologic Pain up patient to the ingrown bilateral hallux lateral border No open wounds. No skin lesions.  Orthopedic: Pain on palpation left first metatarsophalangeal joint pain at the end range of motion no deep intra-articular pain noted no plantarflexion pain noted no flexor extensor tendinitis noted.  Pes planovalgus foot structure noted with calcaneovalgus to many toe signs partially but recurred the arch with dorsiflexion of the hallux.  No bunion deformity noted.  Limited range of motion noted of the first metatarsophalangeal joint   Radiographs: None Assessment:   1. Capsulitis of metatarsophalangeal (MTP) joint of left foot   2. Ingrown left big toenail   3. Ingrown toenail of right foot   4. Pes planovalgus    Plan:  Patient was evaluated and treated and all questions answered.  Left  first MTP capsulitis with hallux limitus -All questions and concerns were discussed with the patient extensively the patient will benefit from a steroid injection to help decrease inflammatory component associate with pain.  Patient agrees with plan like to proceed with steroid injection -A steroid injection was performed at left first MTP using 1% plain Lidocaine and 10 mg of Kenalog. This was well  tolerated.  Ingrown Nail, bilaterally -Patient elects to proceed with minor surgery to remove ingrown toenail removal today. Consent reviewed and signed by patient. -Ingrown nail excised. See procedure note. -Educated on post-procedure care including soaking. Written instructions provided and reviewed. -Patient to follow up in 2 weeks for nail check.  Pes planovalgus -I explained to patient the etiology of pes planovalgus and relationship with Planter fasciitis and various treatment options were discussed.  Given patient foot structure in the setting of Planter fasciitis I believe patient will benefit from custom-made orthotics to help control the hindfoot motion support the arch of the foot and take the stress away from plantar fascial.  Patient agrees with the plan like to proceed with orthotics -Patient will be seen by Trish for casting with soft Morton's extension  Procedure: Excision of Ingrown Toenail Location: Bilateral 1st toe lateral nail borders. Anesthesia: Lidocaine 1% plain; 1.5 mL and Marcaine 0.5% plain; 1.5 mL, digital block. Skin Prep: Betadine. Dressing: Silvadene; telfa; dry, sterile, compression dressing. Technique: Following skin prep, the toe was exsanguinated and a tourniquet was secured at the base of the toe. The affected nail border was freed, split with a nail splitter, and excised. Chemical matrixectomy was then performed with phenol and irrigated out with alcohol. The tourniquet was then removed and sterile dressing applied. Disposition: Patient tolerated procedure well. Patient to return in 2 weeks for follow-up.   No follow-ups on file.  No follow-ups on file.

## 2023-03-31 DIAGNOSIS — F419 Anxiety disorder, unspecified: Secondary | ICD-10-CM | POA: Diagnosis not present

## 2023-03-31 DIAGNOSIS — F32A Depression, unspecified: Secondary | ICD-10-CM | POA: Diagnosis not present

## 2023-04-30 ENCOUNTER — Other Ambulatory Visit: Payer: Commercial Managed Care - PPO

## 2023-05-13 ENCOUNTER — Other Ambulatory Visit: Payer: Self-pay

## 2023-05-25 ENCOUNTER — Encounter: Payer: Self-pay | Admitting: Obstetrics and Gynecology

## 2023-05-25 ENCOUNTER — Other Ambulatory Visit: Payer: Self-pay | Admitting: Obstetrics and Gynecology

## 2023-05-25 DIAGNOSIS — N6489 Other specified disorders of breast: Secondary | ICD-10-CM

## 2023-07-01 ENCOUNTER — Ambulatory Visit
Admission: RE | Admit: 2023-07-01 | Discharge: 2023-07-01 | Disposition: A | Discharge status: Home / Self Care | Payer: Commercial Managed Care - PPO | Source: Ambulatory Visit | Attending: Obstetrics and Gynecology | Admitting: Obstetrics and Gynecology

## 2023-07-01 DIAGNOSIS — N6489 Other specified disorders of breast: Secondary | ICD-10-CM | POA: Insufficient documentation

## 2023-07-01 DIAGNOSIS — R928 Other abnormal and inconclusive findings on diagnostic imaging of breast: Secondary | ICD-10-CM | POA: Diagnosis not present

## 2023-07-01 DIAGNOSIS — R92321 Mammographic fibroglandular density, right breast: Secondary | ICD-10-CM | POA: Diagnosis not present

## 2023-07-21 ENCOUNTER — Other Ambulatory Visit: Payer: Self-pay

## 2023-08-09 ENCOUNTER — Other Ambulatory Visit: Payer: Self-pay

## 2023-08-09 MED ORDER — ESCITALOPRAM OXALATE 20 MG PO TABS
20.0000 mg | ORAL_TABLET | Freq: Every day | ORAL | 0 refills | Status: DC
  Filled 2023-08-09: qty 90, 90d supply, fill #0
  Filled 2023-12-15: qty 90, 90d supply, fill #1

## 2023-08-10 DIAGNOSIS — R5383 Other fatigue: Secondary | ICD-10-CM | POA: Diagnosis not present

## 2023-09-09 DIAGNOSIS — R5383 Other fatigue: Secondary | ICD-10-CM | POA: Diagnosis not present

## 2023-09-22 DIAGNOSIS — Z136 Encounter for screening for cardiovascular disorders: Secondary | ICD-10-CM | POA: Diagnosis not present

## 2023-09-22 DIAGNOSIS — F419 Anxiety disorder, unspecified: Secondary | ICD-10-CM | POA: Diagnosis not present

## 2023-09-22 DIAGNOSIS — F32A Depression, unspecified: Secondary | ICD-10-CM | POA: Diagnosis not present

## 2023-09-22 DIAGNOSIS — Z0000 Encounter for general adult medical examination without abnormal findings: Secondary | ICD-10-CM | POA: Diagnosis not present

## 2023-09-22 DIAGNOSIS — Z131 Encounter for screening for diabetes mellitus: Secondary | ICD-10-CM | POA: Diagnosis not present

## 2023-09-29 DIAGNOSIS — Z0000 Encounter for general adult medical examination without abnormal findings: Secondary | ICD-10-CM | POA: Diagnosis not present

## 2023-09-29 DIAGNOSIS — R5383 Other fatigue: Secondary | ICD-10-CM | POA: Diagnosis not present

## 2023-11-05 DIAGNOSIS — M25561 Pain in right knee: Secondary | ICD-10-CM | POA: Diagnosis not present

## 2023-11-16 ENCOUNTER — Other Ambulatory Visit: Payer: Self-pay | Admitting: Obstetrics and Gynecology

## 2023-11-18 ENCOUNTER — Other Ambulatory Visit: Payer: Self-pay | Admitting: Obstetrics and Gynecology

## 2023-11-18 DIAGNOSIS — N6489 Other specified disorders of breast: Secondary | ICD-10-CM

## 2023-12-01 DIAGNOSIS — Z01419 Encounter for gynecological examination (general) (routine) without abnormal findings: Secondary | ICD-10-CM | POA: Diagnosis not present

## 2023-12-01 DIAGNOSIS — Z1339 Encounter for screening examination for other mental health and behavioral disorders: Secondary | ICD-10-CM | POA: Diagnosis not present

## 2023-12-01 DIAGNOSIS — Z1331 Encounter for screening for depression: Secondary | ICD-10-CM | POA: Diagnosis not present

## 2023-12-15 ENCOUNTER — Other Ambulatory Visit: Payer: Self-pay

## 2023-12-15 MED ORDER — ESCITALOPRAM OXALATE 20 MG PO TABS
20.0000 mg | ORAL_TABLET | Freq: Every day | ORAL | 0 refills | Status: DC
  Filled 2023-12-15: qty 90, 90d supply, fill #0
  Filled 2024-03-21: qty 90, 90d supply, fill #1

## 2024-01-06 ENCOUNTER — Ambulatory Visit
Admission: RE | Admit: 2024-01-06 | Discharge: 2024-01-06 | Disposition: A | Discharge status: Home / Self Care | Source: Ambulatory Visit | Attending: Obstetrics and Gynecology | Admitting: Obstetrics and Gynecology

## 2024-01-06 DIAGNOSIS — R92323 Mammographic fibroglandular density, bilateral breasts: Secondary | ICD-10-CM | POA: Diagnosis not present

## 2024-01-06 DIAGNOSIS — R928 Other abnormal and inconclusive findings on diagnostic imaging of breast: Secondary | ICD-10-CM | POA: Diagnosis not present

## 2024-01-06 DIAGNOSIS — N6489 Other specified disorders of breast: Secondary | ICD-10-CM | POA: Diagnosis not present

## 2024-01-18 ENCOUNTER — Ambulatory Visit: Payer: Self-pay | Admitting: Obstetrics and Gynecology

## 2024-02-15 DIAGNOSIS — H8111 Benign paroxysmal vertigo, right ear: Secondary | ICD-10-CM | POA: Diagnosis not present

## 2024-03-21 ENCOUNTER — Other Ambulatory Visit: Payer: Self-pay

## 2024-03-22 ENCOUNTER — Other Ambulatory Visit: Payer: Self-pay

## 2024-03-22 MED ORDER — ESCITALOPRAM OXALATE 20 MG PO TABS
20.0000 mg | ORAL_TABLET | Freq: Every day | ORAL | 0 refills | Status: AC
  Filled 2024-03-22: qty 90, 90d supply, fill #0

## 2024-04-04 ENCOUNTER — Other Ambulatory Visit: Payer: Self-pay

## 2024-04-04 DIAGNOSIS — F419 Anxiety disorder, unspecified: Secondary | ICD-10-CM | POA: Diagnosis not present

## 2024-04-04 DIAGNOSIS — F32A Depression, unspecified: Secondary | ICD-10-CM | POA: Diagnosis not present

## 2024-04-04 MED ORDER — ESCITALOPRAM OXALATE 20 MG PO TABS
20.0000 mg | ORAL_TABLET | Freq: Every day | ORAL | 1 refills | Status: AC
  Filled 2024-04-04 – 2024-06-16 (×2): qty 90, 90d supply, fill #0

## 2024-06-16 ENCOUNTER — Other Ambulatory Visit: Payer: Self-pay
